# Patient Record
Sex: Female | Born: 1945 | Race: White | Hispanic: No | Smoking: Never smoker
Health system: Southern US, Community
[De-identification: ages and names within clinical notes are randomized; demographics above are authoritative.]

## PROBLEM LIST (undated history)

## (undated) DIAGNOSIS — Z860100 Personal history of colon polyps, unspecified: Secondary | ICD-10-CM

## (undated) DIAGNOSIS — A6000 Herpesviral infection of urogenital system, unspecified: Secondary | ICD-10-CM

## (undated) DIAGNOSIS — C4491 Basal cell carcinoma of skin, unspecified: Secondary | ICD-10-CM

## (undated) DIAGNOSIS — M1990 Unspecified osteoarthritis, unspecified site: Secondary | ICD-10-CM

## (undated) DIAGNOSIS — E7800 Pure hypercholesterolemia, unspecified: Secondary | ICD-10-CM

## (undated) DIAGNOSIS — E559 Vitamin D deficiency, unspecified: Secondary | ICD-10-CM

## (undated) DIAGNOSIS — M810 Age-related osteoporosis without current pathological fracture: Secondary | ICD-10-CM

## (undated) HISTORY — PX: FRACTURE SURGERY: SHX138

## (undated) HISTORY — PX: COLONOSCOPY: SHX174

## (undated) HISTORY — PX: BREAST CYST ASPIRATION: SHX578

## (undated) HISTORY — PX: WRIST SURGERY: SHX841

---

## 2008-07-19 ENCOUNTER — Ambulatory Visit: Payer: Self-pay | Admitting: Gastroenterology

## 2008-10-10 ENCOUNTER — Emergency Department: Payer: Self-pay | Admitting: Emergency Medicine

## 2008-10-15 ENCOUNTER — Ambulatory Visit: Payer: Self-pay | Admitting: Orthopedic Surgery

## 2008-10-19 ENCOUNTER — Ambulatory Visit: Payer: Self-pay | Admitting: Orthopedic Surgery

## 2013-09-01 ENCOUNTER — Ambulatory Visit: Payer: Self-pay | Admitting: Gastroenterology

## 2013-09-02 LAB — PATHOLOGY REPORT

## 2014-09-22 ENCOUNTER — Other Ambulatory Visit: Payer: Self-pay | Admitting: Internal Medicine

## 2014-09-22 DIAGNOSIS — IMO0002 Reserved for concepts with insufficient information to code with codable children: Secondary | ICD-10-CM

## 2014-09-28 ENCOUNTER — Ambulatory Visit
Admission: RE | Admit: 2014-09-28 | Discharge: 2014-09-28 | Disposition: A | Discharge status: Home / Self Care | Payer: Commercial Managed Care - HMO | Source: Ambulatory Visit | Attending: Internal Medicine | Admitting: Internal Medicine

## 2014-09-28 DIAGNOSIS — IMO0002 Reserved for concepts with insufficient information to code with codable children: Secondary | ICD-10-CM

## 2014-09-28 DIAGNOSIS — N941 Dyspareunia: Secondary | ICD-10-CM | POA: Insufficient documentation

## 2014-11-23 ENCOUNTER — Encounter
Admission: RE | Admit: 2014-11-23 | Discharge: 2014-11-23 | Disposition: A | Discharge status: Home / Self Care | Payer: Commercial Managed Care - HMO | Source: Ambulatory Visit | Attending: Obstetrics and Gynecology | Admitting: Obstetrics and Gynecology

## 2014-11-23 DIAGNOSIS — Z01818 Encounter for other preprocedural examination: Secondary | ICD-10-CM | POA: Diagnosis not present

## 2014-11-23 HISTORY — DX: Pure hypercholesterolemia, unspecified: E78.00

## 2014-11-23 HISTORY — DX: Unspecified osteoarthritis, unspecified site: M19.90

## 2014-11-23 LAB — BASIC METABOLIC PANEL
Anion gap: 8 (ref 5–15)
BUN: 26 mg/dL — AB (ref 6–20)
CALCIUM: 9.4 mg/dL (ref 8.9–10.3)
CO2: 27 mmol/L (ref 22–32)
CREATININE: 0.66 mg/dL (ref 0.44–1.00)
Chloride: 106 mmol/L (ref 101–111)
Glucose, Bld: 98 mg/dL (ref 65–99)
Potassium: 4.1 mmol/L (ref 3.5–5.1)
SODIUM: 141 mmol/L (ref 135–145)

## 2014-11-23 LAB — CBC
HEMATOCRIT: 40.1 % (ref 35.0–47.0)
HEMOGLOBIN: 13.5 g/dL (ref 12.0–16.0)
MCH: 30.2 pg (ref 26.0–34.0)
MCHC: 33.7 g/dL (ref 32.0–36.0)
MCV: 89.6 fL (ref 80.0–100.0)
Platelets: 286 10*3/uL (ref 150–440)
RBC: 4.48 MIL/uL (ref 3.80–5.20)
RDW: 13 % (ref 11.5–14.5)
WBC: 5.5 10*3/uL (ref 3.6–11.0)

## 2014-11-23 LAB — TYPE AND SCREEN
ABO/RH(D): A POS
ANTIBODY SCREEN: NEGATIVE

## 2014-11-23 LAB — ABO/RH: ABO/RH(D): A POS

## 2014-11-23 NOTE — Patient Instructions (Signed)
  Your procedure is scheduled on: Friday 12/03/2014 Report to Day Surgery. 2ND FLOOR MEDICAL MALL ENTRANCE To find out your arrival time please call [phone] between 1PM - 3PM on Thursday 12/02/2014.  Remember: Instructions that are not followed completely may result in serious medical risk, up to and including death, or upon the discretion of your surgeon and anesthesiologist your surgery may need to be rescheduled.    __X__ 1. Do not eat food or drink liquids after midnight. No gum chewing or hard candies.     __X__ 2. No Alcohol for 24 hours before or after surgery.   ____ 3. Bring all medications with you on the day of surgery if instructed.    __X__ 4. Notify your doctor if there is any change in your medical condition     (cold, fever, infections).     Do not wear jewelry, make-up, hairpins, clips or nail polish.  Do not wear lotions, powders, or perfumes. You may wear deodorant.  Do not shave 48 hours prior to surgery. Men may shave face and neck.  Do not bring valuables to the hospital.    [facility] is not responsible for any belongings or valuables.               Contacts, dentures or bridgework may not be worn into surgery.  Leave your suitcase in the car. After surgery it may be brought to your room.  For patients admitted to the hospital, discharge time is determined by your                treatment team.   Patients discharged the day of surgery will not be allowed to drive home.   Please read over the following fact sheets that you were given:   Surgical Site Infection Prevention   ____ Take these medicines the morning of surgery with A SIP OF WATER:    1.   2.   3.   4.  5.  6.  ____ Fleet Enema (as directed)   ____ Use CHG Soap as directed  ____ Use inhalers on the day of surgery  ____ Stop metformin 2 days prior to surgery    ____ Take 1/2 of usual insulin dose the night before surgery and none on the morning of surgery.   __X__ Stop  Coumadin/Plavix/aspirin on 11/24/2014 (10 DAYS PRIOR TO PROCEDURE)  ____ Stop Anti-inflammatories on    __X__ Stop supplements until after surgery.  11/27/2014 (7 DAYS PRIOR TO PROCEDURE) B12, FISH OIL  ____ Bring C-Pap to the hospital.

## 2014-11-23 NOTE — H&P (Addendum)
LAYTON TAPPAN is a 69 y.o. female who presents for follow up after embx.  Hx of pelvic pain: - 3 month h/o left pelvic pain during intercourse. Patient only experiences the pain during sex. She describes it as a shooting pain radiating to rib cage, lasts a few minutes until sex over. No abnl discharge, no vaginal bleeding  Menopause age 59, histroy of AUB prior to menopause, took a medicine to regulate menses prior to menopause, unsure of the name.  C/S x 2 No ha, blurry vision, other complaints  No h/o abnl paps  12 system ROS otherwise negative  Patient had a TVUS ordered by her Internist on 09/28/14 which showed an EMS of 23mm with 10mm cystic structure. No uterine or adnexal masses visualized.   Menstrual History: OB History    Gravida Para Term Preterm AB TAB SAB Ectopic Multiple Living   2 2 2             No LMP recorded. Patient is postmenopausal.     Past Medical History:  has a past medical history of Unspecified vitamin D deficiency; Other closed fractures of distal end of radius (alone); Senile osteoporosis; Genital herpes; Colon polyp (09/01/13); Other and unspecified hyperlipidemia; and Endometrial thickening on ultra sound. Problem List: has Benign neoplasm of colon and Other and unspecified hyperlipidemia on her problem list. Past Surgical History:  has past surgical history that includes LEFT WRIST SURGERY 2010; C-SECTIONS x 2; Colonoscopy (07/19/2008); Colonoscopy (09/01/13); and Cesarean section. Family History: family history includes COPD in her mother; Diabetes type II in her sister; Liver disease in her father. Social History:  reports that she has never smoked. She has never used smokeless tobacco. She reports that she does not drink alcohol or use illicit drugs. Current Medications: has a current medication list which includes the following prescription(s): calcium carbonate, cyanocobalamin, magnesium, multivitamin, omega-3 fatty  acids-vitamin e, simvastatin, valacyclovir, and zinc. Prior to encounter Medications:  Current Outpatient Prescriptions on File Prior to Visit  Medication Sig Dispense Refill  . calcium carbonate (CALCIUM 600) 600 mg (1,500 mg) Tab tablet Take 600 mg by mouth 2 (two) times daily with meals.    . cyanocobalamin (VITAMIN B-12) 1000 MCG tablet Take 1,000 mcg by mouth once daily.    . magnesium 200 mg Take by mouth once daily.    . multivitamin capsule Take 1 capsule by mouth once daily.    Marland Kitchen omega-3 fatty acids-vitamin E (FISH OIL) 1,000 mg Take 1 capsule by mouth once daily.    . simvastatin (ZOCOR) 20 MG tablet TAKE ONE TABLET BY MOUTH NIGHTLY 90 tablet 1  . valACYclovir (VALTREX) 500 MG tablet Take 500 mg by mouth 2 (two) times daily as needed.    . zinc 15 mg Tab Take by mouth once daily.     No current facility-administered medications on file prior to visit.   Allergies: has No Known Allergies.   Objective:     Vitals    Filed Vitals:   11/18/14 1012  BP: 159/73  Pulse: 67  Weight: 61.508 kg (135 lb 9.6 oz)       PHYSICAL EXAM: BP 159/73 mmHg  Pulse 67  Wt 61.508 kg (135 lb 9.6 oz) Body mass index is 23.26 kg/(m^2). General appearance: alert, cooperative, pleasant, in no acute distress Heart: regular rate and rhythm, without murmur Lungs: clear to auscultation, without rales or wheeze, good air exchange Abdomen: soft, nondistended, nontender, no hepatosplenomegaly or masses Ext: no edema in LE bilaterally,  good distal pulses  Pelvic exam: VULVA: normal appearing vulva with no masses, tenderness or lesions, VAGINA: atrophic, CERVIX: normal appearing cervix without discharge or lesions,; no CMT; UTERUS: uterus is normal size, shape, consistency, tender on bimanual at fundus in left; ADNEXA: normal adnexa in size, nontender and no masses.  Embx: fragments of endometrial polyp, no  dysplasia/neoplasia   Assessment:     69yo G2P2 with left pelvic dysparunia, thickened EMS on sono, and evidence of endometrial polyp on embx. To schedule for [facility] hysteroscopy, polypectomy.  Plan:    1. Endometrial polyp in post-menopausal woman - D&C hysteroscopy, polypectomy - r/b/a reviewed, Risks of surgery were discussed with the patient including but not limited to: bleeding which may require transfusion; infection which may require antibiotics; injury to uterus or surrounding organs; intrauterine scarring which may impair future fertility; need for additional procedures including laparotomy or laparoscopy; and other postoperative/anesthesia complications. Written informed consent was obtained. - This is a scheduled same-day surgery. She will have a postop visit in 2 weeks to review operative findings and pathology.  2. Elevated BP on two office visits - pt states she is nervous, but will likely need evaluation  - will order EKG and BMP in addition to CBC and T&S for pre-op testing - counseling done on lifestyle modifications  Joylene Igo, MD

## 2014-12-03 ENCOUNTER — Encounter
Admission: RE | Disposition: A | Discharge status: Home / Self Care | Payer: Self-pay | Source: Ambulatory Visit | Attending: Obstetrics and Gynecology

## 2014-12-03 ENCOUNTER — Ambulatory Visit
Admission: RE | Admit: 2014-12-03 | Discharge: 2014-12-03 | Disposition: A | Discharge status: Home / Self Care | Payer: Commercial Managed Care - HMO | Source: Ambulatory Visit | Attending: Obstetrics and Gynecology | Admitting: Obstetrics and Gynecology

## 2014-12-03 ENCOUNTER — Ambulatory Visit: Payer: Commercial Managed Care - HMO | Admitting: Anesthesiology

## 2014-12-03 ENCOUNTER — Encounter: Payer: Self-pay | Admitting: *Deleted

## 2014-12-03 DIAGNOSIS — Z86010 Personal history of colonic polyps: Secondary | ICD-10-CM | POA: Insufficient documentation

## 2014-12-03 DIAGNOSIS — Z825 Family history of asthma and other chronic lower respiratory diseases: Secondary | ICD-10-CM | POA: Insufficient documentation

## 2014-12-03 DIAGNOSIS — Z833 Family history of diabetes mellitus: Secondary | ICD-10-CM | POA: Diagnosis not present

## 2014-12-03 DIAGNOSIS — Z79899 Other long term (current) drug therapy: Secondary | ICD-10-CM | POA: Insufficient documentation

## 2014-12-03 DIAGNOSIS — N950 Postmenopausal bleeding: Secondary | ICD-10-CM | POA: Diagnosis present

## 2014-12-03 DIAGNOSIS — E559 Vitamin D deficiency, unspecified: Secondary | ICD-10-CM | POA: Diagnosis not present

## 2014-12-03 DIAGNOSIS — Z8379 Family history of other diseases of the digestive system: Secondary | ICD-10-CM | POA: Insufficient documentation

## 2014-12-03 DIAGNOSIS — R938 Abnormal findings on diagnostic imaging of other specified body structures: Secondary | ICD-10-CM | POA: Insufficient documentation

## 2014-12-03 DIAGNOSIS — M1990 Unspecified osteoarthritis, unspecified site: Secondary | ICD-10-CM | POA: Insufficient documentation

## 2014-12-03 DIAGNOSIS — E785 Hyperlipidemia, unspecified: Secondary | ICD-10-CM | POA: Diagnosis not present

## 2014-12-03 DIAGNOSIS — N840 Polyp of corpus uteri: Secondary | ICD-10-CM | POA: Diagnosis not present

## 2014-12-03 HISTORY — PX: HYSTEROSCOPY WITH D & C: SHX1775

## 2014-12-03 SURGERY — DILATATION AND CURETTAGE /HYSTEROSCOPY
Anesthesia: General | Site: Uterus | Wound class: Clean Contaminated

## 2014-12-03 MED ORDER — GLYCOPYRROLATE 0.2 MG/ML IJ SOLN
INTRAMUSCULAR | Status: DC | PRN
  Administered 2014-12-03: 0.2 mg via INTRAVENOUS

## 2014-12-03 MED ORDER — LACTATED RINGERS IV SOLN
INTRAVENOUS | Status: DC
  Administered 2014-12-03: 08:00:00 via INTRAVENOUS

## 2014-12-03 MED ORDER — PROPOFOL 10 MG/ML IV BOLUS
INTRAVENOUS | Status: DC | PRN
  Administered 2014-12-03: 150 mg via INTRAVENOUS

## 2014-12-03 MED ORDER — ONDANSETRON HCL 4 MG/2ML IJ SOLN: 4.0000 mg | Freq: Once | INTRAMUSCULAR | Status: DC | PRN

## 2014-12-03 MED ORDER — ONDANSETRON HCL 4 MG/2ML IJ SOLN
INTRAMUSCULAR | Status: DC | PRN
Start: 2014-12-03 — End: 2014-12-03
  Administered 2014-12-03: 4 mg via INTRAVENOUS

## 2014-12-03 MED ORDER — FENTANYL CITRATE (PF) 100 MCG/2ML IJ SOLN
INTRAMUSCULAR | Status: DC | PRN
  Administered 2014-12-03: 50 ug via INTRAVENOUS

## 2014-12-03 MED ORDER — MIDAZOLAM HCL 2 MG/2ML IJ SOLN
INTRAMUSCULAR | Status: DC | PRN
  Administered 2014-12-03: 1 mg via INTRAVENOUS

## 2014-12-03 MED ORDER — LIDOCAINE HCL (CARDIAC) 20 MG/ML IV SOLN
INTRAVENOUS | Status: DC | PRN
  Administered 2014-12-03: 30 mg via INTRAVENOUS

## 2014-12-03 MED ORDER — FAMOTIDINE 20 MG PO TABS
20.0000 mg | ORAL_TABLET | Freq: Once | ORAL | Status: AC
  Administered 2014-12-03: 20 mg via ORAL

## 2014-12-03 MED ORDER — FENTANYL CITRATE (PF) 100 MCG/2ML IJ SOLN: 25.0000 ug | INTRAMUSCULAR | Status: DC | PRN

## 2014-12-03 MED ORDER — FAMOTIDINE 20 MG PO TABS
ORAL_TABLET | ORAL | Status: AC
  Administered 2014-12-03: 20 mg via ORAL
  Filled 2014-12-03: qty 1

## 2014-12-03 SURGICAL SUPPLY — 17 items
CANISTER SUCT 3000ML (MISCELLANEOUS) ×3 IMPLANT
CATH ROBINSON RED A/P 16FR (CATHETERS) ×3 IMPLANT
ELECT RESECT POWERBALL 24F (MISCELLANEOUS) ×3 IMPLANT
GLOVE BIO SURGEON STRL SZ7 (GLOVE) ×3 IMPLANT
GLOVE BIOGEL PI IND STRL 7.5 (GLOVE) ×1 IMPLANT
GLOVE BIOGEL PI INDICATOR 7.5 (GLOVE) ×2
GOWN STRL REUS W/ TWL LRG LVL3 (GOWN DISPOSABLE) ×2 IMPLANT
GOWN STRL REUS W/TWL LRG LVL3 (GOWN DISPOSABLE) ×4
IV LACTATED RINGERS 1000ML (IV SOLUTION) ×3 IMPLANT
KIT RM TURNOVER CYSTO AR (KITS) ×3 IMPLANT
LOOP CUT RT ANGL 28F (MISCELLANEOUS) ×3 IMPLANT
PACK DNC HYST (MISCELLANEOUS) ×3 IMPLANT
PAD GROUND ADULT SPLIT (MISCELLANEOUS) ×3 IMPLANT
PAD OB MATERNITY 4.3X12.25 (PERSONAL CARE ITEMS) ×3 IMPLANT
PAD PREP 24X41 OB/GYN DISP (PERSONAL CARE ITEMS) ×3 IMPLANT
TUBING CONNECTING 10 (TUBING) ×2 IMPLANT
TUBING CONNECTING 10' (TUBING) ×1

## 2014-12-03 NOTE — Anesthesia Postprocedure Evaluation (Signed)
  Anesthesia Post-op Note  Patient: Tracy Andrews  Procedure(s) Performed: Procedure(s): DILATATION AND CURETTAGE /HYSTEROSCOPY (N/A)  Anesthesia type:General  Patient location: PACU  Post pain: Pain level controlled  Post assessment: Post-op Vital signs reviewed, Patient's Cardiovascular Status Stable, Respiratory Function Stable, Patent Airway and No signs of Nausea or vomiting  Post vital signs: Reviewed and stable  Last Vitals:  Filed Vitals:   12/03/14 1132  BP: 183/66  Pulse: 56  Temp:   Resp: 14    Level of consciousness: awake, alert  and patient cooperative  Complications: No apparent anesthesia complications

## 2014-12-03 NOTE — Anesthesia Procedure Notes (Signed)
Date/Time: 12/03/2014 9:33 AM Performed by: Courtney Paris Pre-anesthesia Checklist: Patient identified, Emergency Drugs available, Suction available and Patient being monitored Patient Re-evaluated:Patient Re-evaluated prior to inductionOxygen Delivery Method: Circle system utilized Preoxygenation: Pre-oxygenation with 100% oxygen Intubation Type: IV induction and Combination inhalational/ intravenous induction Ventilation: Mask ventilation without difficulty LMA: LMA inserted LMA Size: 4.0 Grade View: Grade II Number of attempts: 1

## 2014-12-03 NOTE — Anesthesia Preprocedure Evaluation (Signed)
Anesthesia Evaluation  Patient identified by MRN, date of birth, ID band Patient awake    Reviewed: Allergy & Precautions, NPO status , Patient's Chart, lab work & pertinent test results, reviewed documented beta blocker date and time   Airway Mallampati: II  TM Distance: >3 FB     Dental  (+) Chipped   Pulmonary          Cardiovascular     Neuro/Psych    GI/Hepatic   Endo/Other    Renal/GU      Musculoskeletal  (+) Arthritis -,   Abdominal   Peds  Hematology   Anesthesia Other Findings   Reproductive/Obstetrics                             Anesthesia Physical Anesthesia Plan  ASA: II  Anesthesia Plan: General   Post-op Pain Management:    Induction: Intravenous  Airway Management Planned: LMA  Additional Equipment:   Intra-op Plan:   Post-operative Plan:   Informed Consent: I have reviewed the patients History and Physical, chart, labs and discussed the procedure including the risks, benefits and alternatives for the proposed anesthesia with the patient or authorized representative who has indicated his/her understanding and acceptance.     Plan Discussed with: CRNA  Anesthesia Plan Comments:         Anesthesia Quick Evaluation  

## 2014-12-03 NOTE — Discharge Instructions (Signed)
Thank you for letting me take care of you! Here are your instructions. See you in 2 weeks!  - Dr Newman Nip  Hysteroscopy, Care After Refer to this sheet in the next few weeks. These instructions provide you with information on caring for yourself after your procedure. Your health care provider may also give you more specific instructions. Your treatment has been planned according to current medical practices, but problems sometimes occur. Call your health care provider if you have any problems or questions after your procedure.  WHAT TO EXPECT AFTER THE PROCEDURE After your procedure, it is typical to have the following:  You may have some cramping. This normally lasts for a couple days.  You may have bleeding. This can vary from light spotting for a few days to menstrual-like bleeding for 3-7 days. HOME CARE INSTRUCTIONS  Rest for the first 1-2 days after the procedure.  Take ibuprofen 400mg  every 4 hours or 800mg  every 8 hours as needed for pain  Cramping should last 24-48hrs  Spotting is expected for at least a week  Only take over-the-counter or prescription medicines as directed by your health care provider.   Take showers instead of baths for 2 weeks or as directed by your health care provider.  Do not drive for 24 hours or as directed.  Do not drink alcohol while taking pain medicine.  Do not use tampons, douche, or have sexual intercourse for 2 weeks or until your health care provider says it is okay.  Follow your health care provider's advice about diet, exercise, and lifting.  If you develop constipation, you may:  Take a mild laxative if your health care provider approves.  Add bran foods to your diet.  Drink enough fluids to keep your urine clear or pale yellow.  Try to have someone with you or available to you for the first 24-48 hours, especially if you were given a general anesthetic.  Follow up with your health care provider as directed. SEEK MEDICAL CARE  IF:  You feel dizzy or lightheaded.  You feel sick to your stomach (nauseous).  You have abnormal vaginal discharge that is itchy or foul smelling  You have a rash.  You have pain that is not controlled with medicine. SEEK IMMEDIATE MEDICAL CARE IF:  You have bleeding that is heavier than a normal menstrual period.  You have a fever.  You have increasing cramps or pain, not controlled with medicine.  You have new belly (abdominal) pain.  You pass out.  You have pain in the tops of your shoulders (shoulder strap areas).  You have shortness of breath. Document Released: 12/31/2012 Document Reviewed: 12/31/2012 [facility] Patient Information 2015 [place], [place]. This information is not intended to replace advice given to you by your health care provider. Make sure you discuss any questions you have with your health care provider.

## 2014-12-03 NOTE — Transfer of Care (Signed)
Immediate Anesthesia Transfer of Care Note  Patient: Tracy Andrews  Procedure(s) Performed: Procedure(s): DILATATION AND CURETTAGE /HYSTEROSCOPY (N/A)  Patient Location: PACU  Anesthesia Type:General  Level of Consciousness: awake, oriented and patient cooperative  Airway & Oxygen Therapy: Patient Spontanous Breathing and Patient connected to face mask oxygen  Post-op Assessment: Report given to RN and Post -op Vital signs reviewed and stable  Post vital signs: Reviewed and stable  Last Vitals:  Filed Vitals:   12/03/14 0800  BP: 165/77  Pulse: 76  Temp: 36.7 C  Resp: 16    Complications: No apparent anesthesia complications

## 2014-12-03 NOTE — Op Note (Signed)
Operative Report Hysteroscopy with Dilation and Curettage   Indications: Post-menopausal bleeding, evidence of polyp on endometrial biopsy  Pre-operative Diagnosis: endometrial polyp  Post-operative Diagnosis: same.  Procedure: 1. Exam under anesthesia 2. D&C 3. Hysteroscopy 4. Polypectomy  Surgeon: Joylene Igo, MD  Assistant(s):  None  Anesthesia: General LMA anesthesia  Estimated Blood Loss:  Minimal         Intraoperative medications: none         Total IV Fluids: 558ml  Urine Output: 43ml         Specimens:  endometrial curettings, endometrial polyp         Complications:  None; patient tolerated the procedure well.         Disposition: PACU - hemodynamically stable.         Condition: stable  Findings: Uterus measuring 6 cm by sound; normal cervix, vagina, perineum. 1cm polyp noted along right lateral wall near right ostium and obstructing its view. Otherwise, normal cavity, atrophic  Indication for procedure/Consents: 69 y.o. here for scheduled surgery for the aforementioned diagnoses.   Risks of surgery were discussed with the patient including but not limited to: bleeding which may require transfusion; infection which may require antibiotics; injury to uterus or surrounding organs; intrauterine scarring which may impair future fertility; need for additional procedures including laparotomy or laparoscopy; and other postoperative/anesthesia complications. Written informed consent was obtained.    Procedure Details:   The patient was taken to the operating room where general LMA mask anesthesia was administered and was found to be adequate. After a formal and adequate timeout was performed, she was placed in the dorsal lithotomy position and examined with the above findings. She was then prepped and draped in the sterile manner. Her bladder was catheterized for an estimated amount of clear, yellow urine. A weighed speculum was then placed in the patient's vagina  and a single tooth tenaculum was applied to the anterior lip of the cervix.  Her cervix was serially dilated to 67mm with Hagar dilators. The hysteroscope was introduced to reveal the above findings. A polyp forcep was used to remove the polyp. The hysteroscope was reintroduced to confirm removal of entire polyp.  A sharp curettage was then performed until there was a gritty texture in all four quadrants. The tenaculum was removed from the anterior lip of the cervix and the vaginal speculum was removed after applying silver nitrate for good hemostasis. The patient tolerated the procedure well and was taken to the recovery area awake and in stable condition.   The patient will be discharged to home as per PACU criteria. Routine postoperative instructions given. She was prescribed Ibuprofen and Colace. She will follow up in the clinic in two weeks for postoperative evaluation.

## 2014-12-03 NOTE — Interval H&P Note (Signed)
History and Physical Interval Note:  12/03/2014 8:36 AM  Tracy Andrews  has presented today for surgery, with the diagnosis of ENDOMETRIAL POLYP  The various methods of treatment have been discussed with the patient and family. After consideration of risks, benefits and other options for treatment, the patient has consented to  Procedure(s): DILATATION AND CURETTAGE /HYSTEROSCOPY (N/A) as a surgical intervention .  The patient's history has been reviewed, patient examined, no change in status, stable for surgery.  I have reviewed the patient's chart and labs.  Questions were answered to the patient's satisfaction.     [place]

## 2014-12-06 LAB — SURGICAL PATHOLOGY

## 2015-04-06 DIAGNOSIS — E7800 Pure hypercholesterolemia, unspecified: Secondary | ICD-10-CM | POA: Diagnosis not present

## 2015-04-06 DIAGNOSIS — N941 Unspecified dyspareunia: Secondary | ICD-10-CM | POA: Diagnosis not present

## 2015-04-06 DIAGNOSIS — J069 Acute upper respiratory infection, unspecified: Secondary | ICD-10-CM | POA: Diagnosis not present

## 2015-04-13 DIAGNOSIS — Z139 Encounter for screening, unspecified: Secondary | ICD-10-CM | POA: Diagnosis not present

## 2015-04-13 DIAGNOSIS — Z0000 Encounter for general adult medical examination without abnormal findings: Secondary | ICD-10-CM | POA: Diagnosis not present

## 2015-04-13 DIAGNOSIS — E7800 Pure hypercholesterolemia, unspecified: Secondary | ICD-10-CM | POA: Diagnosis not present

## 2015-04-13 DIAGNOSIS — Z23 Encounter for immunization: Secondary | ICD-10-CM | POA: Diagnosis not present

## 2015-04-18 DIAGNOSIS — Z1231 Encounter for screening mammogram for malignant neoplasm of breast: Secondary | ICD-10-CM | POA: Diagnosis not present

## 2015-10-05 DIAGNOSIS — Z139 Encounter for screening, unspecified: Secondary | ICD-10-CM | POA: Diagnosis not present

## 2015-10-05 DIAGNOSIS — Z0000 Encounter for general adult medical examination without abnormal findings: Secondary | ICD-10-CM | POA: Diagnosis not present

## 2015-10-05 DIAGNOSIS — Z23 Encounter for immunization: Secondary | ICD-10-CM | POA: Diagnosis not present

## 2015-10-05 DIAGNOSIS — E7800 Pure hypercholesterolemia, unspecified: Secondary | ICD-10-CM | POA: Diagnosis not present

## 2015-10-12 DIAGNOSIS — N941 Unspecified dyspareunia: Secondary | ICD-10-CM | POA: Diagnosis not present

## 2015-10-12 DIAGNOSIS — R3129 Other microscopic hematuria: Secondary | ICD-10-CM | POA: Diagnosis not present

## 2015-10-12 DIAGNOSIS — E7800 Pure hypercholesterolemia, unspecified: Secondary | ICD-10-CM | POA: Diagnosis not present

## 2015-10-12 DIAGNOSIS — R221 Localized swelling, mass and lump, neck: Secondary | ICD-10-CM | POA: Diagnosis not present

## 2015-10-12 DIAGNOSIS — Z23 Encounter for immunization: Secondary | ICD-10-CM | POA: Diagnosis not present

## 2015-10-28 ENCOUNTER — Ambulatory Visit (INDEPENDENT_AMBULATORY_CARE_PROVIDER_SITE_OTHER): Payer: PPO | Admitting: Urology

## 2015-10-28 ENCOUNTER — Encounter: Payer: Self-pay | Admitting: Urology

## 2015-10-28 VITALS — BP 171/71 | HR 80 | Ht 64.0 in | Wt 134.8 lb

## 2015-10-28 DIAGNOSIS — R3129 Other microscopic hematuria: Secondary | ICD-10-CM

## 2015-10-28 LAB — URINALYSIS, COMPLETE
BILIRUBIN UA: NEGATIVE
Glucose, UA: NEGATIVE
KETONES UA: NEGATIVE
Leukocytes, UA: NEGATIVE
NITRITE UA: NEGATIVE
PH UA: 7 (ref 5.0–7.5)
Protein, UA: NEGATIVE
SPEC GRAV UA: 1.02 (ref 1.005–1.030)
UUROB: 0.2 mg/dL (ref 0.2–1.0)

## 2015-10-28 LAB — MICROSCOPIC EXAMINATION

## 2015-10-28 NOTE — Progress Notes (Signed)
10/28/2015 12:01 PM   Tracy Andrews 12-12-1945 [id number]  Referring provider: Tracie Harrier, MD [address] [facility] [place], [place] [address]  Chief Complaint  Patient presents with  . Hematuria    referred by Dr. Ginette Pitman    HPI: Patient is a 70 -year-old Caucasian female who presents today as a referral from their PCP, Dr. Ginette Pitman, for microscopic hematuria.  Patient was found to have microscopic hematuria on 09/15/2013, 04/06/2015, and 10/05/2015 with 4-10 RBC's/hpf.  Patient doesn't have a prior history of microscopic hematuria.    She does not have a prior history of recurrent urinary tract infections, nephrolithiasis, trauma to the genitourinary tract or malignancies of the genitourinary tract.   She does not have a family medical history of nephrolithiasis, malignancies of the genitourinary tract or hematuria.    Today, she is not having symptoms of frequent urination, urgency, dysuria, nocturia, incontinence, hesitancy, intermittency, straining to urinate or a weak urinary stream.  Her UA today demonstrates 3-10 RBC's/hpf.  She is not experiencing any suprapubic pain, abdominal pain or flank pain.  She denies any recent fevers, chills, nausea or vomiting.   She has not had any recent imaging studies.   She is not a smoker. She was exposed to secondhand smoke.  Her parents were heavy smokers and her husband just quit smoking two years ago.  She has not worked with [employer].    PMH: Past Medical History:  Diagnosis Date  . Arthritis   . Cancer ([place])    skin  . Elevated cholesterol     Surgical History: Past Surgical History:  Procedure Laterality Date  . CESAREAN SECTION     x 2  . HYSTEROSCOPY W/D&C N/A 12/03/2014   Procedure: DILATATION AND CURETTAGE /HYSTEROSCOPY;  Surgeon: Lorette Ang, MD;  Location: [facility] ORS;  Service: Gynecology;  Laterality: N/A;  . WRIST SURGERY Left    put a pin in it    Home Medications:    Medication List       Accurate as of 10/28/15 12:01 PM. Always use your most recent med list.          aspirin EC 81 MG tablet Take 81 mg by mouth daily.   CALCIUM 600 600 MG Tabs tablet Generic drug:  calcium carbonate Take by mouth.   conjugated estrogens vaginal cream Commonly known as:  PREMARIN Place vaginally.   Fish Oil 1000 MG Caps Take 1 capsule by mouth 2 (two) times daily.   FISH OIL PO Take by mouth.   Magnesium 250 MG Tabs Take 1 tablet by mouth daily.   multivitamin capsule Take by mouth.   multivitamin with minerals Tabs tablet Take 1 tablet by mouth daily.   simvastatin 20 MG tablet Commonly known as:  ZOCOR Take 20 mg by mouth at bedtime.   valACYclovir 500 MG tablet Commonly known as:  VALTREX Take by mouth.   vitamin B-12 500 MCG tablet Commonly known as:  CYANOCOBALAMIN Take 500 mcg by mouth daily.   Vitamin D-3 1000 units Caps Take 1 capsule by mouth daily.   Zinc Gluconate 15 MG Tabs Take by mouth.       Allergies: No Known Allergies  Family History: Family History  Problem Relation Age of Onset  . Kidney disease Neg Hx   . Bladder Cancer Neg Hx     Social History:  reports that she has never smoked. She has never used smokeless tobacco. She reports that she does  not drink alcohol or use drugs.  ROS: UROLOGY Frequent Urination?: No Hard to postpone urination?: No Burning/pain with urination?: No Get up at night to urinate?: No Leakage of urine?: No Urine stream starts and stops?: No Trouble starting stream?: No Do you have to strain to urinate?: No Blood in urine?: Yes Urinary tract infection?: No Sexually transmitted disease?: No Injury to kidneys or bladder?: No Painful intercourse?: Yes Weak stream?: No Currently pregnant?: No Vaginal bleeding?: No Last menstrual period?: n  Gastrointestinal Nausea?: No Vomiting?: No Indigestion/heartburn?: No Diarrhea?: No Constipation?: No  Constitutional Fever:  No Night sweats?: No Weight loss?: No Fatigue?: No  Skin Skin rash/lesions?: No Itching?: No  Eyes Blurred vision?: No Double vision?: No  Ears/Nose/Throat Sore throat?: No Sinus problems?: No  Hematologic/Lymphatic Swollen glands?: No Easy bruising?: No  Cardiovascular Leg swelling?: No Chest pain?: No  Respiratory Cough?: No Shortness of breath?: No  Endocrine Excessive thirst?: No  Musculoskeletal Back pain?: No Joint pain?: No  Neurological Headaches?: No Dizziness?: No  Psychologic Depression?: No Anxiety?: No  Physical Exam: BP (!) 171/71   Pulse 80   Ht 5\' 4"  (1.626 m)   Wt 134 lb 12.8 oz (61.1 kg)   BMI 23.14 kg/m   Constitutional: Well nourished. Alert and oriented, No acute distress. HEENT:  AT, moist mucus membranes. Trachea midline, no masses. Cardiovascular: No clubbing, cyanosis, or edema. Respiratory: Normal respiratory effort, no increased work of breathing. GI: Abdomen is soft, non tender, non distended, no abdominal masses. Liver and spleen not palpable.  No hernias appreciated.  Stool sample for occult testing is not indicated.   GU: No CVA tenderness.  No bladder fullness or masses.  Atrophic external genitalia, normal pubic hair distribution, no lesions.  Normal urethral meatus, no lesions, no prolapse, no discharge.   No urethral masses, tenderness and/or tenderness. No bladder fullness, tenderness or masses. Pale vagina mucosa, poor estrogen effect, no discharge, no lesions, good pelvic support, no cystocele or rectocele noted.  No cervical motion tenderness.  Uterus is freely mobile and non-fixed.  No adnexal/parametria masses or tenderness noted.  Anus and perineum are without rashes or lesions.    Skin: No rashes, bruises or suspicious lesions. Lymph: No cervical or inguinal adenopathy. Neurologic: Grossly intact, no focal deficits, moving all 4 extremities. Psychiatric: Normal mood and affect.  Laboratory Data: Lab Results    Component Value Date   WBC 5.5 11/23/2014   HGB 13.5 11/23/2014   HCT 40.1 11/23/2014   MCV 89.6 11/23/2014   PLT 286 11/23/2014    Lab Results  Component Value Date   CREATININE 0.66 11/23/2014    Urinalysis Results for orders placed or performed in visit on 10/28/15  Microscopic Examination  Result Value Ref Range   WBC, UA 0-5 0 - 5 /hpf   RBC, UA 3-10 (A) 0 - 2 /hpf   Epithelial Cells (non renal) 0-10 0 - 10 /hpf   Bacteria, UA Few None seen/Few  Urinalysis, Complete  Result Value Ref Range   Specific Gravity, UA 1.020 1.005 - 1.030   pH, UA 7.0 5.0 - 7.5   Color, UA Yellow Yellow   Appearance Ur Clear Clear   Leukocytes, UA Negative Negative   Protein, UA Negative Negative/Trace   Glucose, UA Negative Negative   Ketones, UA Negative Negative   RBC, UA 1+ (A) Negative   Bilirubin, UA Negative Negative   Urobilinogen, Ur 0.2 0.2 - 1.0 mg/dL   Nitrite, UA Negative Negative  Microscopic Examination See below:     Assessment & Plan:    1. Microscopic hematuria  I explained to the patient that there are a number of causes that can be associated with blood in the urine, such as stones, UTI's, damage to the urinary tract and/or cancer.  At this time, I felt that the patient warranted further urologic evaluation.   The AUA guidelines state that a CT urogram is the preferred imaging study to evaluate hematuria.  I explained to the patient that a contrast material will be injected into a vein and that in rare instances, an allergic reaction can result and may even life threatening   The patient denies any allergies to contrast, iodine and/or seafood and is not taking metformin.  Her reproductive status is status post menopausal.  Following the imaging study,  I've recommended a cystoscopy. I described how this is performed, typically in an office setting with a flexible cystoscope. We described the risks, benefits, and possible side effects, the most common of which is a  minor amount of blood in the urine and/or burning which usually resolves in 24 to 48 hours.    The patient had the opportunity to ask questions which were answered. Based upon this discussion, the patient is willing to proceed. Therefore, I've ordered: a CT Urogram and cystoscopy.  She will return following all of the above for discussion of the results.     - Urinalysis, Complete - CULTURE, URINE COMPREHENSIVE - BUN+Creat   Return for CT Urogram and cystoscopy for hematuria.  These notes generated with voice recognition software. I apologize for typographical errors.  Zara Council, [facility] [address] [place], [place] [address] [phone]

## 2015-10-29 LAB — BUN+CREAT
BUN/Creatinine Ratio: 26 (ref 12–28)
BUN: 18 mg/dL (ref 8–27)
Creatinine, Ser: 0.7 mg/dL (ref 0.57–1.00)
GFR calc Af Amer: 102 mL/min/{1.73_m2} (ref >59)
GFR calc non Af Amer: 89 mL/min/{1.73_m2} (ref >59)

## 2015-10-31 LAB — CULTURE, URINE COMPREHENSIVE

## 2015-11-07 ENCOUNTER — Ambulatory Visit
Admission: RE | Admit: 2015-11-07 | Discharge: 2015-11-07 | Disposition: A | Discharge status: Home / Self Care | Payer: PPO | Source: Ambulatory Visit | Attending: Urology | Admitting: Urology

## 2015-11-07 ENCOUNTER — Ambulatory Visit: Admission: RE | Admit: 2015-11-07 | Payer: PPO | Source: Ambulatory Visit

## 2015-11-07 DIAGNOSIS — K5790 Diverticulosis of intestine, part unspecified, without perforation or abscess without bleeding: Secondary | ICD-10-CM | POA: Diagnosis not present

## 2015-11-07 DIAGNOSIS — K5730 Diverticulosis of large intestine without perforation or abscess without bleeding: Secondary | ICD-10-CM | POA: Diagnosis not present

## 2015-11-07 DIAGNOSIS — R911 Solitary pulmonary nodule: Secondary | ICD-10-CM | POA: Diagnosis not present

## 2015-11-07 DIAGNOSIS — R3129 Other microscopic hematuria: Secondary | ICD-10-CM | POA: Insufficient documentation

## 2015-11-07 DIAGNOSIS — N281 Cyst of kidney, acquired: Secondary | ICD-10-CM | POA: Diagnosis not present

## 2015-11-07 HISTORY — DX: Basal cell carcinoma of skin, unspecified: C44.91

## 2015-11-07 MED ORDER — IOPAMIDOL (ISOVUE-300) INJECTION 61%
125.0000 mL | Freq: Once | INTRAVENOUS | Status: AC | PRN
  Administered 2015-11-07: 125 mL via INTRAVENOUS

## 2015-11-08 ENCOUNTER — Telehealth: Payer: Self-pay

## 2015-11-08 NOTE — Telephone Encounter (Signed)
-----   Message from Nori Riis, PA-C sent at 11/07/2015  9:08 PM EDT ----- Please notify the patient that her CT report did not identify any abnormalities within her kidneys. She will receive a more detailed report at her cystoscopy appointment.

## 2015-11-08 NOTE — Telephone Encounter (Signed)
Spoke with pt in reference to CT results. Pt voiced understanding.  

## 2015-11-11 ENCOUNTER — Ambulatory Visit: Payer: Self-pay | Admitting: Urology

## 2015-11-22 ENCOUNTER — Encounter: Payer: Self-pay | Admitting: Urology

## 2015-11-22 ENCOUNTER — Ambulatory Visit (INDEPENDENT_AMBULATORY_CARE_PROVIDER_SITE_OTHER): Payer: PPO | Admitting: Urology

## 2015-11-22 VITALS — BP 163/71 | HR 64 | Ht 64.0 in | Wt 133.1 lb

## 2015-11-22 DIAGNOSIS — R3129 Other microscopic hematuria: Secondary | ICD-10-CM

## 2015-11-22 DIAGNOSIS — E785 Hyperlipidemia, unspecified: Secondary | ICD-10-CM | POA: Insufficient documentation

## 2015-11-22 DIAGNOSIS — D126 Benign neoplasm of colon, unspecified: Secondary | ICD-10-CM | POA: Insufficient documentation

## 2015-11-22 LAB — MICROSCOPIC EXAMINATION

## 2015-11-22 LAB — URINALYSIS, COMPLETE
BILIRUBIN UA: NEGATIVE
Glucose, UA: NEGATIVE
Ketones, UA: NEGATIVE
Leukocytes, UA: NEGATIVE
Nitrite, UA: NEGATIVE
PH UA: 5.5 (ref 5.0–7.5)
PROTEIN UA: NEGATIVE
Specific Gravity, UA: 1.025 (ref 1.005–1.030)
UUROB: 0.2 mg/dL (ref 0.2–1.0)

## 2015-11-22 MED ORDER — LIDOCAINE HCL 2 % EX GEL
1.0000 "application " | Freq: Once | CUTANEOUS | Status: AC
  Administered 2015-11-22: 1 via URETHRAL

## 2015-11-22 MED ORDER — CIPROFLOXACIN HCL 500 MG PO TABS
500.0000 mg | ORAL_TABLET | Freq: Once | ORAL | Status: AC
  Administered 2015-11-22: 500 mg via ORAL

## 2015-11-22 NOTE — Progress Notes (Signed)
11/22/2015 2:55 PM   Tracy Andrews 05/02/1945 [id number]  Referring provider: Tracie Harrier, MD [address] [facility] [place], [place] [address]  Chief Complaint  Patient presents with  . Cysto    HPI: Tracy Andrews is a 70yo seen for microhematuria. CT urogram was negative for calculi and renal/ureteral tumors. She denies gross hematuria.    PMH: Past Medical History:  Diagnosis Date  . Arthritis   . Basal cell carcinoma of skin    skin  . Elevated cholesterol     Surgical History: Past Surgical History:  Procedure Laterality Date  . CESAREAN SECTION     x 2  . HYSTEROSCOPY W/D&C N/A 12/03/2014   Procedure: DILATATION AND CURETTAGE /HYSTEROSCOPY;  Surgeon: Tracy Ang, MD;  Location: [facility] ORS;  Service: Gynecology;  Laterality: N/A;  . WRIST SURGERY Left    put a pin in it    Home Medications:    Medication List       Accurate as of 11/22/15  2:55 PM. Always use your most recent med list.          aspirin EC 81 MG tablet Take 81 mg by mouth daily.   CALCIUM 600 600 MG Tabs tablet Generic drug:  calcium carbonate Take by mouth.   conjugated estrogens vaginal cream Commonly known as:  PREMARIN Place vaginally.   Fish Oil 1000 MG Caps Take 1 capsule by mouth 2 (two) times daily.   FISH OIL PO Take by mouth.   Magnesium 250 MG Tabs Take 1 tablet by mouth daily.   multivitamin capsule Take by mouth.   multivitamin with minerals Tabs tablet Take 1 tablet by mouth daily.   simvastatin 20 MG tablet Commonly known as:  ZOCOR Take 20 mg by mouth at bedtime.   valACYclovir 500 MG tablet Commonly known as:  VALTREX Take by mouth.   vitamin B-12 500 MCG tablet Commonly known as:  CYANOCOBALAMIN Take 500 mcg by mouth daily.   Vitamin D-3 1000 units Caps Take 1 capsule by mouth daily.   Zinc Gluconate 15 MG Tabs Take by mouth.       Allergies: No Known Allergies  Family History: Family History  Problem  Relation Age of Onset  . Kidney disease Neg Hx   . Bladder Cancer Neg Hx     Social History:  reports that she has never smoked. She has never used smokeless tobacco. She reports that she does not drink alcohol or use drugs.  ROS:                                        Physical Exam: BP (!) 163/71   Pulse 64   Ht 5\' 4"  (1.626 m)   Wt 60.4 kg (133 lb 1.6 oz)   BMI 22.85 kg/m   Constitutional:  Alert and oriented, No acute distress. HEENT: [place] AT, moist mucus membranes.  Trachea midline, no masses. Cardiovascular: No clubbing, cyanosis, or edema. Respiratory: Normal respiratory effort, no increased work of breathing. GI: Abdomen is soft, nontender, nondistended, no abdominal masses GU: No CVA tenderness.  Skin: No rashes, bruises or suspicious lesions. Lymph: No cervical or inguinal adenopathy. Neurologic: Grossly intact, no focal deficits, moving all 4 extremities. Psychiatric: Normal mood and affect.  Laboratory Data: Lab Results  Component Value Date   WBC 5.5 11/23/2014   HGB 13.5 11/23/2014   HCT  40.1 11/23/2014   MCV 89.6 11/23/2014   PLT 286 11/23/2014    Lab Results  Component Value Date   CREATININE 0.70 10/28/2015    No results found for: PSA  No results found for: TESTOSTERONE  No results found for: HGBA1C  Urinalysis    Component Value Date/Time   APPEARANCEUR Clear 10/28/2015 1125   GLUCOSEU Negative 10/28/2015 1125   BILIRUBINUR Negative 10/28/2015 1125   PROTEINUR Negative 10/28/2015 1125   NITRITE Negative 10/28/2015 1125   LEUKOCYTESUR Negative 10/28/2015 1125     Cystoscopy Procedure Note  Patient identification was confirmed, informed consent was obtained, and patient was prepped using Betadine solution.  Lidocaine jelly was administered per urethral meatus.    Preoperative abx where received prior to procedure.    Procedure: - Flexible cystoscope introduced, without any difficulty.   - Thorough search of the  bladder revealed:    normal urethral meatus    normal urothelium    no stones    no ulcers     no tumors    no urethral polyps    no trabeculation  - Ureteral orifices were normal in position and appearance.  Post-Procedure: - Patient tolerated the procedure well   Pertinent Imaging: CT Urogram  Assessment & Plan:    1. Microscopic hematuria -RTC 6 months with UA - Urinalysis, Complete - lidocaine (XYLOCAINE) 2 % jelly 1 application; Place 1 application into the urethra once. - ciprofloxacin (CIPRO) tablet 500 mg; Take 1 tablet (500 mg total) by mouth once.   No Follow-up on file.  Tracy Bang, MD  [facility] [address], [place], [place] [address] [phone]

## 2016-01-19 DIAGNOSIS — H2513 Age-related nuclear cataract, bilateral: Secondary | ICD-10-CM | POA: Diagnosis not present

## 2016-01-19 DIAGNOSIS — D3132 Benign neoplasm of left choroid: Secondary | ICD-10-CM | POA: Diagnosis not present

## 2016-04-09 DIAGNOSIS — R221 Localized swelling, mass and lump, neck: Secondary | ICD-10-CM | POA: Diagnosis not present

## 2016-04-09 DIAGNOSIS — N941 Unspecified dyspareunia: Secondary | ICD-10-CM | POA: Diagnosis not present

## 2016-04-09 DIAGNOSIS — E7800 Pure hypercholesterolemia, unspecified: Secondary | ICD-10-CM | POA: Diagnosis not present

## 2016-04-09 DIAGNOSIS — R3129 Other microscopic hematuria: Secondary | ICD-10-CM | POA: Diagnosis not present

## 2016-04-16 DIAGNOSIS — Z0000 Encounter for general adult medical examination without abnormal findings: Secondary | ICD-10-CM | POA: Diagnosis not present

## 2016-04-16 DIAGNOSIS — I10 Essential (primary) hypertension: Secondary | ICD-10-CM | POA: Insufficient documentation

## 2016-04-16 DIAGNOSIS — R3129 Other microscopic hematuria: Secondary | ICD-10-CM | POA: Diagnosis not present

## 2016-04-16 DIAGNOSIS — E042 Nontoxic multinodular goiter: Secondary | ICD-10-CM | POA: Diagnosis not present

## 2016-04-16 DIAGNOSIS — E7800 Pure hypercholesterolemia, unspecified: Secondary | ICD-10-CM | POA: Diagnosis not present

## 2016-05-22 ENCOUNTER — Ambulatory Visit: Payer: Self-pay

## 2016-05-31 ENCOUNTER — Encounter: Payer: Self-pay | Admitting: Urology

## 2016-05-31 ENCOUNTER — Ambulatory Visit: Payer: PPO | Admitting: Urology

## 2016-05-31 VITALS — BP 150/73 | HR 74 | Ht 64.0 in | Wt 134.7 lb

## 2016-05-31 DIAGNOSIS — R3129 Other microscopic hematuria: Secondary | ICD-10-CM

## 2016-05-31 LAB — MICROSCOPIC EXAMINATION: Epithelial Cells (non renal): >10 /hpf — AB (ref 0–10)

## 2016-05-31 LAB — URINALYSIS, COMPLETE
BILIRUBIN UA: NEGATIVE
GLUCOSE, UA: NEGATIVE
KETONES UA: NEGATIVE
LEUKOCYTES UA: NEGATIVE
Nitrite, UA: NEGATIVE
PROTEIN UA: NEGATIVE
UUROB: 0.2 mg/dL (ref 0.2–1.0)
pH, UA: 5.5 (ref 5.0–7.5)

## 2016-05-31 NOTE — Progress Notes (Signed)
05/31/2016 2:36 PM   Tracy Andrews 1945-07-12 [id number]  Referring provider: Tracie Harrier, MD [address] [facility] [place], [place] [address]  Chief Complaint  Patient presents with  . Follow-up    microscopic hematuria     HPI: The patient is a 71 year old female with a past medical history of microscopic hematuria presents for follow-up. In August 2017 she underwent a negative formal workup for microscopic hematuria which was negative. She has no history of gross hematuria has done well since her last visit. She presents today for post-microscopic hematuria workup repeat urinalysis.    She has persistent microscopic hematuria with 3-10 red blood cells per high-power field on urinalysis today.  PMH: Past Medical History:  Diagnosis Date  . Arthritis   . Basal cell carcinoma of skin    skin  . Elevated cholesterol     Surgical History: Past Surgical History:  Procedure Laterality Date  . CESAREAN SECTION     x 2  . HYSTEROSCOPY W/D&C N/A 12/03/2014   Procedure: DILATATION AND CURETTAGE /HYSTEROSCOPY;  Surgeon: Lorette Ang, MD;  Location: [facility] ORS;  Service: Gynecology;  Laterality: N/A;  . WRIST SURGERY Left    put a pin in it    Home Medications:  Allergies as of 05/31/2016   No Known Allergies     Medication List       Accurate as of 05/31/16  2:36 PM. Always use your most recent med list.          amLODipine 2.5 MG tablet Commonly known as:  NORVASC Take by mouth.   aspirin EC 81 MG tablet Take 81 mg by mouth daily.   CALCIUM 600 600 MG Tabs tablet Generic drug:  calcium carbonate Take by mouth.   conjugated estrogens vaginal cream Commonly known as:  PREMARIN Place vaginally.   FISH OIL PO Take by mouth.   Magnesium 250 MG Tabs Take 1 tablet by mouth daily.   multivitamin capsule Take by mouth.   multivitamin with minerals Tabs tablet Take 1 tablet by mouth daily.   simvastatin 20 MG tablet Commonly  known as:  ZOCOR Take 20 mg by mouth at bedtime.   valACYclovir 500 MG tablet Commonly known as:  VALTREX Take by mouth.   vitamin B-12 500 MCG tablet Commonly known as:  CYANOCOBALAMIN Take 500 mcg by mouth daily.   Vitamin D-3 1000 units Caps Take 1 capsule by mouth daily.   Zinc Gluconate 15 MG Tabs Take by mouth.       Allergies: No Known Allergies  Family History: Family History  Problem Relation Age of Onset  . Kidney disease Neg Hx   . Bladder Cancer Neg Hx     Social History:  reports that she has never smoked. She has never used smokeless tobacco. She reports that she does not drink alcohol or use drugs.  ROS:                                        Physical Exam: BP (!) 150/73   Pulse 74   Ht 5\' 4"  (1.626 m)   Wt 134 lb 11.2 oz (61.1 kg)   BMI 23.12 kg/m   Constitutional:  Alert and oriented, No acute distress. HEENT: [place] AT, moist mucus membranes.  Trachea midline, no masses. Cardiovascular: No clubbing, cyanosis, or edema. Respiratory: Normal respiratory effort, no increased work  of breathing. GI: Abdomen is soft, nontender, nondistended, no abdominal masses GU: No CVA tenderness.  Skin: No rashes, bruises or suspicious lesions. Lymph: No cervical or inguinal adenopathy. Neurologic: Grossly intact, no focal deficits, moving all 4 extremities. Psychiatric: Normal mood and affect.  Laboratory Data: Lab Results  Component Value Date   WBC 5.5 11/23/2014   HGB 13.5 11/23/2014   HCT 40.1 11/23/2014   MCV 89.6 11/23/2014   PLT 286 11/23/2014    Lab Results  Component Value Date   CREATININE 0.70 10/28/2015    No results found for: PSA  No results found for: TESTOSTERONE  No results found for: HGBA1C  Urinalysis    Component Value Date/Time   APPEARANCEUR Clear 11/22/2015 1419   GLUCOSEU Negative 11/22/2015 1419   BILIRUBINUR Negative 11/22/2015 1419   PROTEINUR Negative 11/22/2015 1419   NITRITE Negative  11/22/2015 1419   LEUKOCYTESUR Negative 11/22/2015 1419     Assessment & Plan:    1. Microscopic hematuria Persistent after negative workup in August 2017. We'll recheck her urine in one year. If he remains persistent at 3-5 years after her negative workup, we may consider repeating a formal hematuria workup per AUA guidelines.   Return in about 1 year (around 05/31/2017).  Nickie Retort, MD  [facility] [address] [place], [place] [address] [phone]

## 2016-10-11 DIAGNOSIS — E7800 Pure hypercholesterolemia, unspecified: Secondary | ICD-10-CM | POA: Diagnosis not present

## 2016-10-11 DIAGNOSIS — R3129 Other microscopic hematuria: Secondary | ICD-10-CM | POA: Diagnosis not present

## 2016-10-11 DIAGNOSIS — Z0000 Encounter for general adult medical examination without abnormal findings: Secondary | ICD-10-CM | POA: Diagnosis not present

## 2016-10-11 DIAGNOSIS — E042 Nontoxic multinodular goiter: Secondary | ICD-10-CM | POA: Diagnosis not present

## 2016-10-11 DIAGNOSIS — I10 Essential (primary) hypertension: Secondary | ICD-10-CM | POA: Diagnosis not present

## 2016-10-18 DIAGNOSIS — Z0000 Encounter for general adult medical examination without abnormal findings: Secondary | ICD-10-CM | POA: Diagnosis not present

## 2016-10-18 DIAGNOSIS — I10 Essential (primary) hypertension: Secondary | ICD-10-CM | POA: Diagnosis not present

## 2016-10-18 DIAGNOSIS — R3129 Other microscopic hematuria: Secondary | ICD-10-CM | POA: Diagnosis not present

## 2016-10-18 DIAGNOSIS — L538 Other specified erythematous conditions: Secondary | ICD-10-CM | POA: Diagnosis not present

## 2016-10-18 DIAGNOSIS — E042 Nontoxic multinodular goiter: Secondary | ICD-10-CM | POA: Diagnosis not present

## 2016-10-18 DIAGNOSIS — E7800 Pure hypercholesterolemia, unspecified: Secondary | ICD-10-CM | POA: Diagnosis not present

## 2017-04-11 DIAGNOSIS — R3129 Other microscopic hematuria: Secondary | ICD-10-CM | POA: Diagnosis not present

## 2017-04-11 DIAGNOSIS — Z0000 Encounter for general adult medical examination without abnormal findings: Secondary | ICD-10-CM | POA: Diagnosis not present

## 2017-04-11 DIAGNOSIS — L538 Other specified erythematous conditions: Secondary | ICD-10-CM | POA: Diagnosis not present

## 2017-04-11 DIAGNOSIS — I10 Essential (primary) hypertension: Secondary | ICD-10-CM | POA: Diagnosis not present

## 2017-04-18 DIAGNOSIS — I10 Essential (primary) hypertension: Secondary | ICD-10-CM | POA: Diagnosis not present

## 2017-04-18 DIAGNOSIS — Z0000 Encounter for general adult medical examination without abnormal findings: Secondary | ICD-10-CM | POA: Diagnosis not present

## 2017-04-18 DIAGNOSIS — E7800 Pure hypercholesterolemia, unspecified: Secondary | ICD-10-CM | POA: Diagnosis not present

## 2017-04-18 DIAGNOSIS — Z1231 Encounter for screening mammogram for malignant neoplasm of breast: Secondary | ICD-10-CM | POA: Diagnosis not present

## 2017-04-18 DIAGNOSIS — R6884 Jaw pain: Secondary | ICD-10-CM | POA: Diagnosis not present

## 2017-04-18 DIAGNOSIS — M25512 Pain in left shoulder: Secondary | ICD-10-CM | POA: Diagnosis not present

## 2017-04-22 ENCOUNTER — Other Ambulatory Visit: Payer: Self-pay | Admitting: Internal Medicine

## 2017-04-22 DIAGNOSIS — Z1231 Encounter for screening mammogram for malignant neoplasm of breast: Secondary | ICD-10-CM

## 2017-04-23 DIAGNOSIS — H2513 Age-related nuclear cataract, bilateral: Secondary | ICD-10-CM | POA: Diagnosis not present

## 2017-04-23 DIAGNOSIS — H5203 Hypermetropia, bilateral: Secondary | ICD-10-CM | POA: Diagnosis not present

## 2017-04-23 DIAGNOSIS — D3131 Benign neoplasm of right choroid: Secondary | ICD-10-CM | POA: Diagnosis not present

## 2017-05-06 ENCOUNTER — Ambulatory Visit
Admission: RE | Admit: 2017-05-06 | Discharge: 2017-05-06 | Disposition: A | Discharge status: Home / Self Care | Payer: PPO | Source: Ambulatory Visit | Attending: Internal Medicine | Admitting: Internal Medicine

## 2017-05-06 DIAGNOSIS — Z1231 Encounter for screening mammogram for malignant neoplasm of breast: Secondary | ICD-10-CM | POA: Insufficient documentation

## 2017-05-30 ENCOUNTER — Encounter: Payer: Self-pay | Admitting: Urology

## 2017-05-30 ENCOUNTER — Ambulatory Visit (INDEPENDENT_AMBULATORY_CARE_PROVIDER_SITE_OTHER): Payer: PPO | Admitting: Urology

## 2017-05-30 VITALS — BP 158/75 | HR 72 | Ht 64.0 in | Wt 136.9 lb

## 2017-05-30 DIAGNOSIS — R3129 Other microscopic hematuria: Secondary | ICD-10-CM

## 2017-05-30 NOTE — Progress Notes (Signed)
05/30/2017 11:11 AM   Tracy Andrews [birth year] [id number]  Referring provider: Tracie Harrier, MD [address] [facility] [place], [place] [address]  Chief Complaint  Patient presents with  . Hematuria    HPI: The patient is a 72 year old female with a past medical history of microscopic hematuria presents for follow-up. In August 2017 she underwent a negative formal workup for microscopic hematuria which was negative. She has no history of gross hematuria has done well since her last visit. She presents today for post-microscopic hematuria workup repeat urinalysis.  She had persistent microscopic hematuria with 3-10 red blood cells per high-power field on urinalysis in March 2018.  However, on today's urinalysis her microscopic hematuria has resolved.  She has no complaints over the last year.  No nephrolithiasis.  No gross hematuria.  No UTIs.  No complaints in regards to voiding.   PMH: Past Medical History:  Diagnosis Date  . Arthritis   . Basal cell carcinoma of skin    skin  . Elevated cholesterol     Surgical History: Past Surgical History:  Procedure Laterality Date  . CESAREAN SECTION     x 2  . HYSTEROSCOPY W/D&C N/A 12/03/2014   Procedure: DILATATION AND CURETTAGE /HYSTEROSCOPY;  Surgeon: Lorette Ang, MD;  Location: [facility] ORS;  Service: Gynecology;  Laterality: N/A;  . WRIST SURGERY Left    put a pin in it    Home Medications:  Allergies as of 05/30/2017   No Known Allergies     Medication List        Accurate as of 05/30/17 11:11 AM. Always use your most recent med list.          amLODipine 2.5 MG tablet Commonly known as:  NORVASC Take by mouth.   aspirin EC 81 MG tablet Take 81 mg by mouth daily.   CALCIUM 600 600 MG Tabs tablet Generic drug:  calcium carbonate Take by mouth.   FISH OIL PO Take by mouth.   Magnesium 250 MG Tabs Take 1 tablet by mouth daily.   multivitamin with minerals Tabs tablet Take 1  tablet by mouth daily.   simvastatin 20 MG tablet Commonly known as:  ZOCOR Take 20 mg by mouth at bedtime.   valACYclovir 500 MG tablet Commonly known as:  VALTREX Take by mouth.   vitamin B-12 500 MCG tablet Commonly known as:  CYANOCOBALAMIN Take 500 mcg by mouth daily.   Vitamin D-3 1000 units Caps Take 1 capsule by mouth daily.   Zinc Gluconate 15 MG Tabs Take by mouth.       Allergies: No Known Allergies  Family History: Family History  Problem Relation Age of Onset  . Kidney disease Neg Hx   . Bladder Cancer Neg Hx   . Breast cancer Neg Hx     Social History:  reports that  has never smoked. she has never used smokeless tobacco. She reports that she does not drink alcohol or use drugs.  ROS: UROLOGY Frequent Urination?: No Hard to postpone urination?: No Burning/pain with urination?: No Get up at night to urinate?: No Leakage of urine?: No Urine stream starts and stops?: No Trouble starting stream?: No Do you have to strain to urinate?: No Blood in urine?: Yes Urinary tract infection?: No Sexually transmitted disease?: No Injury to kidneys or bladder?: No Painful intercourse?: No Weak stream?: No Currently pregnant?: No Vaginal bleeding?: No Last menstrual period?: n  Gastrointestinal Nausea?: No Vomiting?: No Indigestion/heartburn?: No Diarrhea?:  No Constipation?: No  Constitutional Fever: No Night sweats?: No Weight loss?: No Fatigue?: No  Skin Skin rash/lesions?: No Itching?: No  Eyes Blurred vision?: No Double vision?: No  Ears/Nose/Throat Sore throat?: No Sinus problems?: No  Hematologic/Lymphatic Swollen glands?: No Easy bruising?: No  Cardiovascular Leg swelling?: No Chest pain?: No  Respiratory Cough?: No Shortness of breath?: No  Endocrine Excessive thirst?: No  Musculoskeletal Back pain?: No Joint pain?: No  Neurological Headaches?: No Dizziness?: No  Psychologic Depression?: No Anxiety?:  No  Physical Exam: BP (!) 158/75 (BP Location: Right Arm, Patient Position: Sitting, Cuff Size: Normal)   Pulse 72   Ht 5\' 4"  (1.626 m)   Wt 136 lb 14.4 oz (62.1 kg)   BMI 23.50 kg/m   Constitutional:  Alert and oriented, No acute distress. HEENT: [place] AT, moist mucus membranes.  Trachea midline, no masses. Cardiovascular: No clubbing, cyanosis, or edema. Respiratory: Normal respiratory effort, no increased work of breathing. GI: Abdomen is soft, nontender, nondistended, no abdominal masses GU: No CVA tenderness.  Skin: No rashes, bruises or suspicious lesions. Lymph: No cervical or inguinal adenopathy. Neurologic: Grossly intact, no focal deficits, moving all 4 extremities. Psychiatric: Normal mood and affect.  Laboratory Data: Lab Results  Component Value Date   WBC 5.5 11/23/2014   HGB 13.5 11/23/2014   HCT 40.1 11/23/2014   MCV 89.6 11/23/2014   PLT 286 11/23/2014    Lab Results  Component Value Date   CREATININE 0.70 10/28/2015    No results found for: PSA  No results found for: TESTOSTERONE  No results found for: HGBA1C  Urinalysis    Component Value Date/Time   APPEARANCEUR Clear 05/31/2016 1417   GLUCOSEU Negative 05/31/2016 1417   BILIRUBINUR Negative 05/31/2016 1417   PROTEINUR Negative 05/31/2016 1417   NITRITE Negative 05/31/2016 1417   LEUKOCYTESUR Negative 05/31/2016 1417    Assessment & Plan:    1. Microscopic hematuria Her microscopic hematuria has resolved after persistence in March 2018 after a negative workup in August 2017.  We will check her urinalysis one more time to ensure continued resolution in 1 year.  If that is normal, she can follow-up as needed.  Return in about 1 year (around 05/31/2018) for repeat U/A.  Nickie Retort, MD  [facility] [address], [place], [place] [address] [phone]

## 2017-05-31 LAB — MICROSCOPIC EXAMINATION
BACTERIA UA: NONE SEEN
WBC, UA: NONE SEEN /hpf (ref 0–?)

## 2017-05-31 LAB — URINALYSIS, COMPLETE
Bilirubin, UA: NEGATIVE
GLUCOSE, UA: NEGATIVE
KETONES UA: NEGATIVE
Leukocytes, UA: NEGATIVE
NITRITE UA: NEGATIVE
Protein, UA: NEGATIVE
SPEC GRAV UA: 1.025 (ref 1.005–1.030)
Urobilinogen, Ur: 0.2 mg/dL (ref 0.2–1.0)
pH, UA: 5 (ref 5.0–7.5)

## 2017-09-17 DIAGNOSIS — I8312 Varicose veins of left lower extremity with inflammation: Secondary | ICD-10-CM | POA: Diagnosis not present

## 2017-09-17 DIAGNOSIS — I8311 Varicose veins of right lower extremity with inflammation: Secondary | ICD-10-CM | POA: Diagnosis not present

## 2017-10-08 DIAGNOSIS — I8312 Varicose veins of left lower extremity with inflammation: Secondary | ICD-10-CM | POA: Diagnosis not present

## 2017-10-08 DIAGNOSIS — I8311 Varicose veins of right lower extremity with inflammation: Secondary | ICD-10-CM | POA: Diagnosis not present

## 2017-10-14 DIAGNOSIS — I10 Essential (primary) hypertension: Secondary | ICD-10-CM | POA: Diagnosis not present

## 2017-10-14 DIAGNOSIS — M25512 Pain in left shoulder: Secondary | ICD-10-CM | POA: Diagnosis not present

## 2017-10-14 DIAGNOSIS — R6884 Jaw pain: Secondary | ICD-10-CM | POA: Diagnosis not present

## 2017-10-14 DIAGNOSIS — Z0000 Encounter for general adult medical examination without abnormal findings: Secondary | ICD-10-CM | POA: Diagnosis not present

## 2017-10-14 DIAGNOSIS — E7800 Pure hypercholesterolemia, unspecified: Secondary | ICD-10-CM | POA: Diagnosis not present

## 2017-10-14 DIAGNOSIS — Z1231 Encounter for screening mammogram for malignant neoplasm of breast: Secondary | ICD-10-CM | POA: Diagnosis not present

## 2017-10-17 DIAGNOSIS — I10 Essential (primary) hypertension: Secondary | ICD-10-CM | POA: Diagnosis not present

## 2017-10-17 DIAGNOSIS — Z0000 Encounter for general adult medical examination without abnormal findings: Secondary | ICD-10-CM | POA: Diagnosis not present

## 2017-10-17 DIAGNOSIS — G2581 Restless legs syndrome: Secondary | ICD-10-CM | POA: Diagnosis not present

## 2017-10-17 DIAGNOSIS — E042 Nontoxic multinodular goiter: Secondary | ICD-10-CM | POA: Diagnosis not present

## 2017-10-18 DIAGNOSIS — I8311 Varicose veins of right lower extremity with inflammation: Secondary | ICD-10-CM | POA: Diagnosis not present

## 2017-10-18 DIAGNOSIS — I8312 Varicose veins of left lower extremity with inflammation: Secondary | ICD-10-CM | POA: Diagnosis not present

## 2017-11-19 DIAGNOSIS — I8311 Varicose veins of right lower extremity with inflammation: Secondary | ICD-10-CM | POA: Diagnosis not present

## 2017-11-19 DIAGNOSIS — I8312 Varicose veins of left lower extremity with inflammation: Secondary | ICD-10-CM | POA: Diagnosis not present

## 2017-12-03 DIAGNOSIS — I8312 Varicose veins of left lower extremity with inflammation: Secondary | ICD-10-CM | POA: Diagnosis not present

## 2017-12-17 DIAGNOSIS — I8311 Varicose veins of right lower extremity with inflammation: Secondary | ICD-10-CM | POA: Diagnosis not present

## 2017-12-31 DIAGNOSIS — I8312 Varicose veins of left lower extremity with inflammation: Secondary | ICD-10-CM | POA: Diagnosis not present

## 2018-01-15 DIAGNOSIS — I8311 Varicose veins of right lower extremity with inflammation: Secondary | ICD-10-CM | POA: Diagnosis not present

## 2018-01-28 DIAGNOSIS — I8312 Varicose veins of left lower extremity with inflammation: Secondary | ICD-10-CM | POA: Diagnosis not present

## 2018-03-14 DIAGNOSIS — J069 Acute upper respiratory infection, unspecified: Secondary | ICD-10-CM | POA: Diagnosis not present

## 2018-03-14 DIAGNOSIS — J40 Bronchitis, not specified as acute or chronic: Secondary | ICD-10-CM | POA: Diagnosis not present

## 2018-04-16 DIAGNOSIS — G2581 Restless legs syndrome: Secondary | ICD-10-CM | POA: Diagnosis not present

## 2018-04-16 DIAGNOSIS — Z0000 Encounter for general adult medical examination without abnormal findings: Secondary | ICD-10-CM | POA: Diagnosis not present

## 2018-04-16 DIAGNOSIS — I10 Essential (primary) hypertension: Secondary | ICD-10-CM | POA: Diagnosis not present

## 2018-04-16 DIAGNOSIS — E042 Nontoxic multinodular goiter: Secondary | ICD-10-CM | POA: Diagnosis not present

## 2018-04-23 DIAGNOSIS — E042 Nontoxic multinodular goiter: Secondary | ICD-10-CM | POA: Diagnosis not present

## 2018-04-23 DIAGNOSIS — I10 Essential (primary) hypertension: Secondary | ICD-10-CM | POA: Diagnosis not present

## 2018-04-23 DIAGNOSIS — Z0000 Encounter for general adult medical examination without abnormal findings: Secondary | ICD-10-CM | POA: Diagnosis not present

## 2018-04-23 DIAGNOSIS — Z780 Asymptomatic menopausal state: Secondary | ICD-10-CM | POA: Diagnosis not present

## 2018-04-23 DIAGNOSIS — E7800 Pure hypercholesterolemia, unspecified: Secondary | ICD-10-CM | POA: Diagnosis not present

## 2018-05-06 DIAGNOSIS — Z780 Asymptomatic menopausal state: Secondary | ICD-10-CM | POA: Diagnosis not present

## 2018-06-02 ENCOUNTER — Ambulatory Visit: Payer: PPO | Admitting: Urology

## 2018-06-02 ENCOUNTER — Encounter: Payer: Self-pay | Admitting: Urology

## 2018-06-02 VITALS — BP 143/69 | HR 69 | Ht 64.0 in | Wt 137.5 lb

## 2018-06-02 DIAGNOSIS — R3129 Other microscopic hematuria: Secondary | ICD-10-CM

## 2018-06-02 LAB — URINALYSIS, COMPLETE
Bilirubin, UA: NEGATIVE
GLUCOSE, UA: NEGATIVE
KETONES UA: NEGATIVE
Leukocytes, UA: NEGATIVE
NITRITE UA: NEGATIVE
Protein, UA: NEGATIVE
SPEC GRAV UA: 1.025 (ref 1.005–1.030)
UUROB: 0.2 mg/dL (ref 0.2–1.0)
pH, UA: 6.5 (ref 5.0–7.5)

## 2018-06-02 LAB — MICROSCOPIC EXAMINATION
Bacteria, UA: NONE SEEN
Epithelial Cells (non renal): NONE SEEN /hpf (ref 0–10)
WBC, UA: NONE SEEN /hpf (ref 0–5)

## 2018-06-02 NOTE — Progress Notes (Signed)
06/02/2018 8:58 AM   Tracy Andrews 1946/03/24 [id number]  Referring provider: Tracie Harrier, MD [address] [facility] [place], [place] [address]  Chief Complaint  Patient presents with  . Hematuria    HPI: 73 year old female previously seen by [facility] and Dr. Pilar Jarvis for asymptomatic microscopic hematuria.  A CT urogram and 2017 showed no significant upper tract abnormalities and cystoscopy was unremarkable.  She was seen last year and her urinalysis was unremarkable.  She presents today for annual follow-up.  She denies flank, abdominal or pelvic pain.  She denies gross hematuria.  Urinalysis at [facility] January 2020 showed no RBCs on microscopy.   PMH: Past Medical History:  Diagnosis Date  . Arthritis   . Basal cell carcinoma of skin    skin  . Elevated cholesterol     Surgical History: Past Surgical History:  Procedure Laterality Date  . CESAREAN SECTION     x 2  . HYSTEROSCOPY W/D&C N/A 12/03/2014   Procedure: DILATATION AND CURETTAGE /HYSTEROSCOPY;  Surgeon: Lorette Ang, MD;  Location: [facility] ORS;  Service: Gynecology;  Laterality: N/A;  . WRIST SURGERY Left    put a pin in it    Home Medications:  Allergies as of 06/02/2018   No Known Allergies     Medication List       Accurate as of June 02, 2018  8:58 AM. Always use your most recent med list.        alendronate 70 MG tablet Commonly known as:  FOSAMAX TAKE 1 TABLET BY MOUTH EVERY 7 DAYS TAKE FIRST THING IN THE MORNING WITH A FULL GLASS OF DISTILLED WATER. DO NOT EAT OR DRINK ANYTHING ELSE   amLODipine 2.5 MG tablet Commonly known as:  NORVASC Take 2.5 mg by mouth 2 (two) times daily.   aspirin EC 81 MG tablet Take 81 mg by mouth daily.   Calcium 600 600 MG Tabs tablet Generic drug:  calcium carbonate Take by mouth.   calcium carbonate 600 MG Tabs tablet Commonly known as:  OS-CAL Take 600 mg by mouth 2 (two) times daily.   FISH OIL PO Take by mouth.   fluticasone 50 MCG/ACT nasal spray Commonly known as:  FLONASE Place into the nose.   Magnesium 250 MG Tabs Take 1 tablet by mouth daily.   multivitamin with minerals Tabs tablet Take 1 tablet by mouth daily.   simvastatin 20 MG tablet Commonly known as:  ZOCOR Take 20 mg by mouth at bedtime.   valACYclovir 500 MG tablet Commonly known as:  VALTREX Take by mouth.   vitamin B-12 500 MCG tablet Commonly known as:  CYANOCOBALAMIN Take 500 mcg by mouth daily.   Vitamin D-3 25 MCG (1000 UT) Caps Take 1 capsule by mouth daily.   Zinc Gluconate 15 MG Tabs Take by mouth.       Allergies: No Known Allergies  Family History: Family History  Problem Relation Age of Onset  . Kidney disease Neg Hx   . Bladder Cancer Neg Hx   . Breast cancer Neg Hx     Social History:  reports that she has never smoked. She has never used smokeless tobacco. She reports that she does not drink alcohol or use drugs.  ROS: UROLOGY Frequent Urination?: No Hard to postpone urination?: No Burning/pain with urination?: No Get up at night to urinate?: No Leakage of urine?: No Urine stream starts and stops?: No Trouble starting stream?: No Do you have to strain  to urinate?: No Blood in urine?: No Urinary tract infection?: No Sexually transmitted disease?: No Injury to kidneys or bladder?: No Painful intercourse?: No Weak stream?: No Currently pregnant?: No Vaginal bleeding?: No Last menstrual period?: Postmenopausal  Gastrointestinal Nausea?: No Vomiting?: No Indigestion/heartburn?: No Diarrhea?: No Constipation?: No  Constitutional Fever: No Night sweats?: No Weight loss?: No Fatigue?: No  Skin Skin rash/lesions?: No Itching?: No  Eyes Blurred vision?: No Double vision?: No  Ears/Nose/Throat Sore throat?: No Sinus problems?: No  Hematologic/Lymphatic Swollen glands?: No Easy bruising?: No  Cardiovascular Leg swelling?: No Chest pain?:  No  Respiratory Cough?: No Shortness of breath?: No  Endocrine Excessive thirst?: No  Musculoskeletal Back pain?: No Joint pain?: No  Neurological Headaches?: No Dizziness?: No  Psychologic Depression?: No Anxiety?: No  Physical Exam: BP (!) 143/69 (BP Location: Left Arm, Patient Position: Sitting, Cuff Size: Normal)   Pulse 69   Ht 5\' 4"  (1.626 m)   Wt 137 lb 8 oz (62.4 kg)   BMI 23.60 kg/m   Constitutional:  Alert and oriented, No acute distress. HEENT: [place] AT, moist mucus membranes.  Trachea midline, no masses. Cardiovascular: No clubbing, cyanosis, or edema. Respiratory: Normal respiratory effort, no increased work of breathing. Skin: No rashes, bruises or suspicious lesions. Neurologic: Grossly intact, no focal deficits, moving all 4 extremities. Psychiatric: Normal mood and affect.  Laboratory Data:  Urinalysis: Microscopy negative for RBCs  Assessment & Plan:   73 year old female with previous negative evaluation for asymptomatic microhematuria.  Urinalysis today  Follow-up as needed.  She will have an annual urinalysis with Dr. Ginette Pitman and return here as needed for significant increase in microhematuria or development of gross hematuria.   Abbie Sons, [place] [address], [place], [place] [address] [phone]

## 2018-11-10 DIAGNOSIS — Z0000 Encounter for general adult medical examination without abnormal findings: Secondary | ICD-10-CM | POA: Diagnosis not present

## 2018-11-10 DIAGNOSIS — Z780 Asymptomatic menopausal state: Secondary | ICD-10-CM | POA: Diagnosis not present

## 2018-11-10 DIAGNOSIS — I10 Essential (primary) hypertension: Secondary | ICD-10-CM | POA: Diagnosis not present

## 2018-11-10 DIAGNOSIS — E7800 Pure hypercholesterolemia, unspecified: Secondary | ICD-10-CM | POA: Diagnosis not present

## 2018-11-11 ENCOUNTER — Other Ambulatory Visit: Payer: Self-pay | Admitting: Internal Medicine

## 2018-11-11 DIAGNOSIS — Z1211 Encounter for screening for malignant neoplasm of colon: Secondary | ICD-10-CM | POA: Diagnosis not present

## 2018-11-11 DIAGNOSIS — Z1239 Encounter for other screening for malignant neoplasm of breast: Secondary | ICD-10-CM | POA: Diagnosis not present

## 2018-11-11 DIAGNOSIS — I10 Essential (primary) hypertension: Secondary | ICD-10-CM | POA: Diagnosis not present

## 2018-11-11 DIAGNOSIS — E7800 Pure hypercholesterolemia, unspecified: Secondary | ICD-10-CM | POA: Diagnosis not present

## 2018-11-11 DIAGNOSIS — Z1231 Encounter for screening mammogram for malignant neoplasm of breast: Secondary | ICD-10-CM

## 2018-11-11 DIAGNOSIS — M810 Age-related osteoporosis without current pathological fracture: Secondary | ICD-10-CM | POA: Diagnosis not present

## 2018-11-21 DIAGNOSIS — D3131 Benign neoplasm of right choroid: Secondary | ICD-10-CM | POA: Diagnosis not present

## 2018-11-21 DIAGNOSIS — H2513 Age-related nuclear cataract, bilateral: Secondary | ICD-10-CM | POA: Diagnosis not present

## 2018-12-01 DIAGNOSIS — I10 Essential (primary) hypertension: Secondary | ICD-10-CM | POA: Diagnosis not present

## 2018-12-01 DIAGNOSIS — H8113 Benign paroxysmal vertigo, bilateral: Secondary | ICD-10-CM | POA: Diagnosis not present

## 2018-12-01 DIAGNOSIS — R42 Dizziness and giddiness: Secondary | ICD-10-CM | POA: Diagnosis not present

## 2018-12-01 DIAGNOSIS — R112 Nausea with vomiting, unspecified: Secondary | ICD-10-CM | POA: Diagnosis not present

## 2018-12-10 DIAGNOSIS — I10 Essential (primary) hypertension: Secondary | ICD-10-CM | POA: Diagnosis not present

## 2018-12-10 DIAGNOSIS — R739 Hyperglycemia, unspecified: Secondary | ICD-10-CM | POA: Diagnosis not present

## 2018-12-10 DIAGNOSIS — R42 Dizziness and giddiness: Secondary | ICD-10-CM | POA: Diagnosis not present

## 2018-12-11 DIAGNOSIS — Z86010 Personal history of colonic polyps: Secondary | ICD-10-CM | POA: Diagnosis not present

## 2018-12-16 ENCOUNTER — Ambulatory Visit
Admission: RE | Admit: 2018-12-16 | Discharge: 2018-12-16 | Disposition: A | Discharge status: Home / Self Care | Payer: PPO | Source: Ambulatory Visit | Attending: Internal Medicine | Admitting: Internal Medicine

## 2018-12-16 DIAGNOSIS — R42 Dizziness and giddiness: Secondary | ICD-10-CM | POA: Diagnosis not present

## 2018-12-16 DIAGNOSIS — Z1231 Encounter for screening mammogram for malignant neoplasm of breast: Secondary | ICD-10-CM | POA: Diagnosis not present

## 2018-12-16 DIAGNOSIS — R739 Hyperglycemia, unspecified: Secondary | ICD-10-CM | POA: Diagnosis not present

## 2018-12-16 DIAGNOSIS — I10 Essential (primary) hypertension: Secondary | ICD-10-CM | POA: Diagnosis not present

## 2019-04-01 ENCOUNTER — Ambulatory Visit: Admit: 2019-04-01 | Payer: PPO | Admitting: Internal Medicine

## 2019-04-01 SURGERY — COLONOSCOPY WITH PROPOFOL
Anesthesia: General

## 2019-05-07 DIAGNOSIS — E7800 Pure hypercholesterolemia, unspecified: Secondary | ICD-10-CM | POA: Diagnosis not present

## 2019-05-07 DIAGNOSIS — M810 Age-related osteoporosis without current pathological fracture: Secondary | ICD-10-CM | POA: Diagnosis not present

## 2019-05-07 DIAGNOSIS — I10 Essential (primary) hypertension: Secondary | ICD-10-CM | POA: Diagnosis not present

## 2019-05-22 DIAGNOSIS — E7800 Pure hypercholesterolemia, unspecified: Secondary | ICD-10-CM | POA: Diagnosis not present

## 2019-05-22 DIAGNOSIS — E042 Nontoxic multinodular goiter: Secondary | ICD-10-CM | POA: Diagnosis not present

## 2019-05-22 DIAGNOSIS — I10 Essential (primary) hypertension: Secondary | ICD-10-CM | POA: Diagnosis not present

## 2019-05-22 DIAGNOSIS — Z0000 Encounter for general adult medical examination without abnormal findings: Secondary | ICD-10-CM | POA: Diagnosis not present

## 2019-06-02 DIAGNOSIS — J40 Bronchitis, not specified as acute or chronic: Secondary | ICD-10-CM | POA: Diagnosis not present

## 2019-06-02 DIAGNOSIS — U071 COVID-19: Secondary | ICD-10-CM | POA: Diagnosis not present

## 2019-06-02 DIAGNOSIS — R110 Nausea: Secondary | ICD-10-CM | POA: Diagnosis not present

## 2019-06-02 DIAGNOSIS — R0602 Shortness of breath: Secondary | ICD-10-CM | POA: Diagnosis not present

## 2019-06-02 DIAGNOSIS — R531 Weakness: Secondary | ICD-10-CM | POA: Diagnosis not present

## 2019-06-02 DIAGNOSIS — Z20822 Contact with and (suspected) exposure to covid-19: Secondary | ICD-10-CM | POA: Diagnosis not present

## 2019-11-12 DIAGNOSIS — I10 Essential (primary) hypertension: Secondary | ICD-10-CM | POA: Diagnosis not present

## 2019-11-12 DIAGNOSIS — Z0000 Encounter for general adult medical examination without abnormal findings: Secondary | ICD-10-CM | POA: Diagnosis not present

## 2019-11-12 DIAGNOSIS — E042 Nontoxic multinodular goiter: Secondary | ICD-10-CM | POA: Diagnosis not present

## 2019-11-12 DIAGNOSIS — E7800 Pure hypercholesterolemia, unspecified: Secondary | ICD-10-CM | POA: Diagnosis not present

## 2019-11-12 DIAGNOSIS — R8290 Unspecified abnormal findings in urine: Secondary | ICD-10-CM | POA: Diagnosis not present

## 2019-11-19 DIAGNOSIS — N390 Urinary tract infection, site not specified: Secondary | ICD-10-CM | POA: Diagnosis not present

## 2019-11-19 DIAGNOSIS — I10 Essential (primary) hypertension: Secondary | ICD-10-CM | POA: Diagnosis not present

## 2019-11-19 DIAGNOSIS — E7800 Pure hypercholesterolemia, unspecified: Secondary | ICD-10-CM | POA: Diagnosis not present

## 2019-11-19 DIAGNOSIS — E042 Nontoxic multinodular goiter: Secondary | ICD-10-CM | POA: Diagnosis not present

## 2019-11-19 DIAGNOSIS — R457 State of emotional shock and stress, unspecified: Secondary | ICD-10-CM | POA: Diagnosis not present

## 2019-11-19 DIAGNOSIS — Z8616 Personal history of COVID-19: Secondary | ICD-10-CM | POA: Diagnosis not present

## 2019-11-19 DIAGNOSIS — R319 Hematuria, unspecified: Secondary | ICD-10-CM | POA: Diagnosis not present

## 2020-01-19 DIAGNOSIS — R457 State of emotional shock and stress, unspecified: Secondary | ICD-10-CM | POA: Diagnosis not present

## 2020-01-19 DIAGNOSIS — N390 Urinary tract infection, site not specified: Secondary | ICD-10-CM | POA: Diagnosis not present

## 2020-01-19 DIAGNOSIS — R319 Hematuria, unspecified: Secondary | ICD-10-CM | POA: Diagnosis not present

## 2020-01-19 DIAGNOSIS — Z8616 Personal history of COVID-19: Secondary | ICD-10-CM | POA: Diagnosis not present

## 2020-01-19 DIAGNOSIS — E042 Nontoxic multinodular goiter: Secondary | ICD-10-CM | POA: Diagnosis not present

## 2020-01-19 DIAGNOSIS — I10 Essential (primary) hypertension: Secondary | ICD-10-CM | POA: Diagnosis not present

## 2020-01-19 DIAGNOSIS — E7800 Pure hypercholesterolemia, unspecified: Secondary | ICD-10-CM | POA: Diagnosis not present

## 2020-02-03 DIAGNOSIS — D3131 Benign neoplasm of right choroid: Secondary | ICD-10-CM | POA: Diagnosis not present

## 2020-02-03 DIAGNOSIS — H2513 Age-related nuclear cataract, bilateral: Secondary | ICD-10-CM | POA: Diagnosis not present

## 2020-02-03 DIAGNOSIS — H5203 Hypermetropia, bilateral: Secondary | ICD-10-CM | POA: Diagnosis not present

## 2020-03-09 DIAGNOSIS — R300 Dysuria: Secondary | ICD-10-CM | POA: Diagnosis not present

## 2020-03-09 DIAGNOSIS — R350 Frequency of micturition: Secondary | ICD-10-CM | POA: Diagnosis not present

## 2020-05-18 DIAGNOSIS — I10 Essential (primary) hypertension: Secondary | ICD-10-CM | POA: Diagnosis not present

## 2020-05-18 DIAGNOSIS — R457 State of emotional shock and stress, unspecified: Secondary | ICD-10-CM | POA: Diagnosis not present

## 2020-05-18 DIAGNOSIS — N390 Urinary tract infection, site not specified: Secondary | ICD-10-CM | POA: Diagnosis not present

## 2020-05-18 DIAGNOSIS — Z8616 Personal history of COVID-19: Secondary | ICD-10-CM | POA: Diagnosis not present

## 2020-05-18 DIAGNOSIS — E7800 Pure hypercholesterolemia, unspecified: Secondary | ICD-10-CM | POA: Diagnosis not present

## 2020-05-18 DIAGNOSIS — E042 Nontoxic multinodular goiter: Secondary | ICD-10-CM | POA: Diagnosis not present

## 2020-05-18 DIAGNOSIS — R319 Hematuria, unspecified: Secondary | ICD-10-CM | POA: Diagnosis not present

## 2020-05-25 ENCOUNTER — Other Ambulatory Visit: Payer: Self-pay | Admitting: Internal Medicine

## 2020-05-25 DIAGNOSIS — R42 Dizziness and giddiness: Secondary | ICD-10-CM | POA: Diagnosis not present

## 2020-05-25 DIAGNOSIS — Z1231 Encounter for screening mammogram for malignant neoplasm of breast: Secondary | ICD-10-CM | POA: Diagnosis not present

## 2020-05-25 DIAGNOSIS — Z86010 Personal history of colonic polyps: Secondary | ICD-10-CM | POA: Diagnosis not present

## 2020-05-25 DIAGNOSIS — Z0000 Encounter for general adult medical examination without abnormal findings: Secondary | ICD-10-CM | POA: Diagnosis not present

## 2020-05-25 DIAGNOSIS — E7800 Pure hypercholesterolemia, unspecified: Secondary | ICD-10-CM | POA: Diagnosis not present

## 2020-05-25 DIAGNOSIS — I10 Essential (primary) hypertension: Secondary | ICD-10-CM | POA: Diagnosis not present

## 2020-05-25 DIAGNOSIS — E042 Nontoxic multinodular goiter: Secondary | ICD-10-CM | POA: Diagnosis not present

## 2020-05-25 DIAGNOSIS — R3129 Other microscopic hematuria: Secondary | ICD-10-CM | POA: Diagnosis not present

## 2020-07-01 ENCOUNTER — Other Ambulatory Visit: Payer: Self-pay

## 2020-07-01 ENCOUNTER — Ambulatory Visit
Admission: RE | Admit: 2020-07-01 | Discharge: 2020-07-01 | Disposition: A | Discharge status: Home / Self Care | Payer: PPO | Source: Ambulatory Visit | Attending: Internal Medicine | Admitting: Internal Medicine

## 2020-07-01 DIAGNOSIS — Z1231 Encounter for screening mammogram for malignant neoplasm of breast: Secondary | ICD-10-CM | POA: Diagnosis not present

## 2020-08-16 DIAGNOSIS — Z86010 Personal history of colonic polyps: Secondary | ICD-10-CM | POA: Diagnosis not present

## 2020-11-24 ENCOUNTER — Encounter: Payer: Self-pay | Admitting: *Deleted

## 2020-11-25 ENCOUNTER — Encounter: Payer: Self-pay | Admitting: *Deleted

## 2020-11-25 ENCOUNTER — Ambulatory Visit: Payer: PPO | Admitting: Registered Nurse

## 2020-11-25 ENCOUNTER — Ambulatory Visit
Admission: RE | Admit: 2020-11-25 | Discharge: 2020-11-25 | Disposition: A | Discharge status: Home / Self Care | Payer: PPO | Attending: Gastroenterology | Admitting: Gastroenterology

## 2020-11-25 ENCOUNTER — Encounter
Admission: RE | Disposition: A | Discharge status: Home / Self Care | Payer: Self-pay | Source: Home / Self Care | Attending: Gastroenterology

## 2020-11-25 ENCOUNTER — Other Ambulatory Visit: Payer: Self-pay

## 2020-11-25 DIAGNOSIS — Z7983 Long term (current) use of bisphosphonates: Secondary | ICD-10-CM | POA: Insufficient documentation

## 2020-11-25 DIAGNOSIS — Z79899 Other long term (current) drug therapy: Secondary | ICD-10-CM | POA: Diagnosis not present

## 2020-11-25 DIAGNOSIS — Z85828 Personal history of other malignant neoplasm of skin: Secondary | ICD-10-CM | POA: Insufficient documentation

## 2020-11-25 DIAGNOSIS — Z7982 Long term (current) use of aspirin: Secondary | ICD-10-CM | POA: Insufficient documentation

## 2020-11-25 DIAGNOSIS — K640 First degree hemorrhoids: Secondary | ICD-10-CM | POA: Insufficient documentation

## 2020-11-25 DIAGNOSIS — K649 Unspecified hemorrhoids: Secondary | ICD-10-CM | POA: Diagnosis not present

## 2020-11-25 DIAGNOSIS — Z1211 Encounter for screening for malignant neoplasm of colon: Secondary | ICD-10-CM | POA: Insufficient documentation

## 2020-11-25 DIAGNOSIS — I10 Essential (primary) hypertension: Secondary | ICD-10-CM | POA: Diagnosis not present

## 2020-11-25 DIAGNOSIS — K5730 Diverticulosis of large intestine without perforation or abscess without bleeding: Secondary | ICD-10-CM | POA: Insufficient documentation

## 2020-11-25 DIAGNOSIS — Z86010 Personal history of colonic polyps: Secondary | ICD-10-CM | POA: Diagnosis not present

## 2020-11-25 HISTORY — DX: Personal history of colon polyps, unspecified: Z86.0100

## 2020-11-25 HISTORY — PX: COLONOSCOPY WITH PROPOFOL: SHX5780

## 2020-11-25 HISTORY — DX: Personal history of colonic polyps: Z86.010

## 2020-11-25 HISTORY — DX: Vitamin D deficiency, unspecified: E55.9

## 2020-11-25 HISTORY — DX: Herpesviral infection of urogenital system, unspecified: A60.00

## 2020-11-25 HISTORY — DX: Age-related osteoporosis without current pathological fracture: M81.0

## 2020-11-25 SURGERY — COLONOSCOPY WITH PROPOFOL
Anesthesia: General

## 2020-11-25 MED ORDER — LIDOCAINE HCL (CARDIAC) PF 100 MG/5ML IV SOSY
PREFILLED_SYRINGE | INTRAVENOUS | Status: DC | PRN
  Administered 2020-11-25: 80 mg via INTRAVENOUS

## 2020-11-25 MED ORDER — SODIUM CHLORIDE 0.9 % IV SOLN: INTRAVENOUS | Status: DC

## 2020-11-25 MED ORDER — PROPOFOL 500 MG/50ML IV EMUL
INTRAVENOUS | Status: DC | PRN
  Administered 2020-11-25: 150 ug/kg/min via INTRAVENOUS

## 2020-11-25 MED ORDER — PROPOFOL 10 MG/ML IV BOLUS
INTRAVENOUS | Status: DC | PRN
  Administered 2020-11-25: 60 mg via INTRAVENOUS

## 2020-11-25 NOTE — H&P (Signed)
Outpatient short stay form Pre-procedure 11/25/2020  Lesly Rubenstein, MD  Primary Physician: Tracie Harrier, MD  Reason for visit:  Surveillance colonoscopy  History of present illness:   75 y/o lady with history of hypertension here for surveillance colonoscopy. Last colonoscopy was 7 years ago and was overall normal. Had adenomatous polyps on colonoscopy done before that one. No blood thinners except aspirin. History of c-sections.    Current Facility-Administered Medications:    0.9 %  sodium chloride infusion, , Intravenous, Continuous, James Lafalce, Hilton Cork, MD, Last Rate: 20 mL/hr at 11/25/20 0941, New Bag at 11/25/20 0941  Medications Prior to Admission  Medication Sig Dispense Refill Last Dose   amLODipine (NORVASC) 2.5 MG tablet Take 2.5 mg by mouth 2 (two) times daily.   11/24/2020 at 0800   aspirin EC 81 MG tablet Take 81 mg by mouth daily.   Past Week   calcium carbonate (OS-CAL) 600 MG TABS tablet Take by mouth.   11/24/2020 at 0800   calcium carbonate (OS-CAL) 600 MG TABS tablet Take 600 mg by mouth 2 (two) times daily.   11/24/2020 at 0800   Cholecalciferol (VITAMIN D-3) 1000 UNITS CAPS Take 1 capsule by mouth daily.   11/24/2020 at 0800   Magnesium 250 MG TABS Take 1 tablet by mouth daily.   11/24/2020 at 0800   Multiple Vitamin (MULTIVITAMIN WITH MINERALS) TABS tablet Take 1 tablet by mouth daily.   11/24/2020 at 0800   Omega-3 Fatty Acids (FISH OIL PO) Take by mouth.   Past Week   simvastatin (ZOCOR) 20 MG tablet Take 20 mg by mouth at bedtime.   11/24/2020 at 0800   vitamin B-12 (CYANOCOBALAMIN) 500 MCG tablet Take 500 mcg by mouth daily.   11/24/2020 at 0800   Zinc Gluconate 15 MG TABS Take by mouth.   11/24/2020 at 0800   alendronate (FOSAMAX) 70 MG tablet TAKE 1 TABLET BY MOUTH EVERY 7 DAYS TAKE FIRST THING IN THE MORNING WITH A FULL GLASS OF DISTILLED WATER. DO NOT EAT OR DRINK ANYTHING ELSE      fluticasone (FLONASE) 50 MCG/ACT nasal spray Place into the nose.      valACYclovir  (VALTREX) 500 MG tablet Take by mouth.        No Known Allergies   Past Medical History:  Diagnosis Date   Arthritis    Basal cell carcinoma of skin    skin   Elevated cholesterol    Genital herpes    Personal history of colonic polyps    Senile osteoporosis    Vitamin D deficiency     Review of systems:  Otherwise negative.    Physical Exam  Gen: Alert, oriented. Appears stated age.  HEENT: PERRLA. Lungs: No respiratory distress CV: RRR Abd: soft, benign, no masses Ext: No edema    Planned procedures: Proceed with colonoscopy. The patient understands the nature of the planned procedure, indications, risks, alternatives and potential complications including but not limited to bleeding, infection, perforation, damage to internal organs and possible oversedation/side effects from anesthesia. The patient agrees and gives consent to proceed.  Please refer to procedure notes for findings, recommendations and patient disposition/instructions.     Lesly Rubenstein, MD [facility] Gastroenterology

## 2020-11-25 NOTE — Op Note (Signed)
[facility] Gastroenterology Patient Name: Tracy Andrews Procedure Date: 11/25/2020 10:19 AM MRN: [id number] Account #: [id number] Date of Birth: [birth year] Admit Type: Outpatient Age: 75 Room: [facility] ENDO ROOM 3 Gender: Female Note Status: Finalized Instrument Name: Peds Colonoscope [id number] Procedure:             Colonoscopy Indications:           Surveillance: Personal history of adenomatous polyps                         on last colonoscopy > 5 years ago Providers:             Andrey Farmer MD, MD Medicines:             Monitored Anesthesia Care Complications:         No immediate complications. Procedure:             Pre-Anesthesia Assessment:                        - Prior to the procedure, a History and Physical was                         performed, and patient medications and allergies were                         reviewed. The patient is competent. The risks and                         benefits of the procedure and the sedation options and                         risks were discussed with the patient. All questions                         were answered and informed consent was obtained.                         Patient identification and proposed procedure were                         verified by the physician, the nurse, the anesthetist                         and the technician in the endoscopy suite. Mental                         Status Examination: alert and oriented. Airway                         Examination: normal oropharyngeal airway and neck                         mobility. Respiratory Examination: clear to                         auscultation. CV Examination: normal. Prophylactic                         Antibiotics: The patient does not require prophylactic  antibiotics. Prior Anticoagulants: The patient has                         taken no previous anticoagulant or antiplatelet                         agents. ASA  Grade Assessment: II - A patient with mild                         systemic disease. After reviewing the risks and                         benefits, the patient was deemed in satisfactory                         condition to undergo the procedure. The anesthesia                         plan was to use monitored anesthesia care (MAC).                         Immediately prior to administration of medications,                         the patient was re-assessed for adequacy to receive                         sedatives. The heart rate, respiratory rate, oxygen                         saturations, blood pressure, adequacy of pulmonary                         ventilation, and response to care were monitored                         throughout the procedure. The physical status of the                         patient was re-assessed after the procedure.                        After obtaining informed consent, the colonoscope was                         passed under direct vision. Throughout the procedure,                         the patient's blood pressure, pulse, and oxygen                         saturations were monitored continuously. The                         Colonoscope was introduced through the anus and                         advanced to the the cecum, identified by appendiceal  orifice and ileocecal valve. The colonoscopy was                         performed without difficulty. The patient tolerated                         the procedure well. The quality of the bowel                         preparation was good. Findings:      The perianal and digital rectal examinations were normal.      Multiple small-mouthed diverticula were found in the sigmoid colon,       descending colon, ascending colon and cecum.      Internal hemorrhoids were found during retroflexion. The hemorrhoids       were Grade I (internal hemorrhoids that do not prolapse).      The exam was  otherwise without abnormality on direct and retroflexion       views. Impression:            - Diverticulosis in the sigmoid colon, in the                         descending colon, in the ascending colon and in the                         cecum.                        - Internal hemorrhoids.                        - The examination was otherwise normal on direct and                         retroflexion views.                        - No specimens collected. Recommendation:        - Discharge patient to home.                        - Resume previous diet.                        - Continue present medications.                        - Repeat colonoscopy is not recommended due to current                         age (76 years or older) for surveillance.                        - Return to referring physician as previously                         scheduled. Procedure Code(s):     --- Professional ---                        P5374, Colorectal cancer screening; colonoscopy on  individual at high risk Diagnosis Code(s):     --- Professional ---                        Z86.010, Personal history of colonic polyps                        K64.0, First degree hemorrhoids                        K57.30, Diverticulosis of large intestine without                         perforation or abscess without bleeding CPT copyright 2019 American Medical Association. All rights reserved. The codes documented in this report are preliminary and upon coder review may  be revised to meet current compliance requirements. Andrey Farmer MD, MD 11/25/2020 10:50:24 AM Number of Addenda: 0 Note Initiated On: 11/25/2020 10:19 AM Scope Withdrawal Time: 0 hours 8 minutes 29 seconds  Total Procedure Duration: 0 hours 11 minutes 28 seconds  Estimated Blood Loss:  Estimated blood loss: none.      [facility]

## 2020-11-25 NOTE — Interval H&P Note (Signed)
History and Physical Interval Note:  11/25/2020 10:26 AM  Tracy Andrews  has presented today for surgery, with the diagnosis of HX ADEN POLYP.  The various methods of treatment have been discussed with the patient and family. After consideration of risks, benefits and other options for treatment, the patient has consented to  Procedure(s): COLONOSCOPY WITH PROPOFOL (N/A) as a surgical intervention.  The patient's history has been reviewed, patient examined, no change in status, stable for surgery.  I have reviewed the patient's chart and labs.  Questions were answered to the patient's satisfaction.     Lesly Rubenstein  Ok to proceed with colonoscopy

## 2020-11-25 NOTE — Anesthesia Preprocedure Evaluation (Signed)
Anesthesia Evaluation  Patient identified by MRN, date of birth, ID band Patient awake    Reviewed: Allergy & Precautions, NPO status , Patient's Chart, lab work & pertinent test results  History of Anesthesia Complications Negative for: history of anesthetic complications  Airway Mallampati: III  TM Distance: <3 FB Neck ROM: limited    Dental  (+) Chipped   Pulmonary neg pulmonary ROS, neg shortness of breath,    Pulmonary exam normal        Cardiovascular Exercise Tolerance: Good hypertension, (-) angina(-) Past MI Normal cardiovascular exam     Neuro/Psych negative neurological ROS  negative psych ROS   GI/Hepatic negative GI ROS, Neg liver ROS,   Endo/Other  negative endocrine ROS  Renal/GU negative Renal ROS  negative genitourinary   Musculoskeletal  (+) Arthritis ,   Abdominal   Peds  Hematology negative hematology ROS (+)   Anesthesia Other Findings Past Medical History: No date: Arthritis No date: Basal cell carcinoma of skin     Comment:  skin No date: Elevated cholesterol No date: Genital herpes No date: Personal history of colonic polyps No date: Senile osteoporosis No date: Vitamin D deficiency  Past Surgical History: No date: BREAST CYST ASPIRATION No date: CESAREAN SECTION     Comment:  x 2 No date: COLONOSCOPY No date: FRACTURE SURGERY 12/03/2014: HYSTEROSCOPY WITH D & C; N/A     Comment:  Procedure: DILATATION AND CURETTAGE /HYSTEROSCOPY;                Surgeon: Lorette Ang, MD;  Location: [facility] ORS;                Service: Gynecology;  Laterality: N/A; No date: WRIST SURGERY; Left     Comment:  put a pin in it  BMI    Body Mass Index: 22.49 kg/m      Reproductive/Obstetrics negative OB ROS                             Anesthesia Physical Anesthesia Plan  ASA: 2  Anesthesia Plan: General   Post-op Pain Management:    Induction:  Intravenous  PONV Risk Score and Plan: Propofol infusion and TIVA  Airway Management Planned: Natural Airway and Nasal Cannula  Additional Equipment:   Intra-op Plan:   Post-operative Plan:   Informed Consent: I have reviewed the patients History and Physical, chart, labs and discussed the procedure including the risks, benefits and alternatives for the proposed anesthesia with the patient or authorized representative who has indicated his/her understanding and acceptance.     Dental Advisory Given  Plan Discussed with: Anesthesiologist, CRNA and Surgeon  Anesthesia Plan Comments: (Patient consented for risks of anesthesia including but not limited to:  - adverse reactions to medications - risk of airway placement if required - damage to eyes, teeth, lips or other oral mucosa - nerve damage due to positioning  - sore throat or hoarseness - Damage to heart, brain, nerves, lungs, other parts of body or loss of life  Patient voiced understanding.)        Anesthesia Quick Evaluation

## 2020-11-25 NOTE — Transfer of Care (Signed)
Immediate Anesthesia Transfer of Care Note  Patient: Tracy Andrews  Procedure(s) Performed: COLONOSCOPY WITH PROPOFOL  Patient Location: Endoscopy Unit  Anesthesia Type:General  Level of Consciousness: drowsy  Airway & Oxygen Therapy: Patient Spontanous Breathing  Post-op Assessment: Report given to RN and Post -op Vital signs reviewed and stable  Post vital signs: Reviewed and stable  Last Vitals:  Vitals Value Taken Time  BP 119/58 11/25/20 1048  Temp    Pulse 60 11/25/20 1048  Resp 15 11/25/20 1048  SpO2 100 % 11/25/20 1048  Vitals shown include unvalidated device data.  Last Pain:  Vitals:   11/25/20 0926  TempSrc: Temporal  PainSc: 0-No pain         Complications: No notable events documented.

## 2020-11-29 NOTE — Anesthesia Postprocedure Evaluation (Signed)
Anesthesia Post Note  Patient: Tracy Andrews  Procedure(s) Performed: COLONOSCOPY WITH PROPOFOL  Patient location during evaluation: Endoscopy Anesthesia Type: General Level of consciousness: awake and alert Pain management: pain level controlled Vital Signs Assessment: post-procedure vital signs reviewed and stable Respiratory status: spontaneous breathing, nonlabored ventilation, respiratory function stable and patient connected to nasal cannula oxygen Cardiovascular status: blood pressure returned to baseline and stable Postop Assessment: no apparent nausea or vomiting Anesthetic complications: no   No notable events documented.   Last Vitals:  Vitals:   11/25/20 0926 11/25/20 1047  BP: (!) 156/79 (!) 119/58  Pulse: 69 62  Resp: 16 15  Temp: (!) 36.1 C 36.4 C  SpO2: 99% 100%    Last Pain:  Vitals:   11/26/20 1052  TempSrc:   PainSc: 0-No pain                 Precious Haws Kapena Hamme

## 2020-12-05 DIAGNOSIS — Z86010 Personal history of colonic polyps: Secondary | ICD-10-CM | POA: Diagnosis not present

## 2020-12-05 DIAGNOSIS — R42 Dizziness and giddiness: Secondary | ICD-10-CM | POA: Diagnosis not present

## 2020-12-05 DIAGNOSIS — E042 Nontoxic multinodular goiter: Secondary | ICD-10-CM | POA: Diagnosis not present

## 2020-12-05 DIAGNOSIS — R8290 Unspecified abnormal findings in urine: Secondary | ICD-10-CM | POA: Diagnosis not present

## 2020-12-05 DIAGNOSIS — E7800 Pure hypercholesterolemia, unspecified: Secondary | ICD-10-CM | POA: Diagnosis not present

## 2020-12-05 DIAGNOSIS — I10 Essential (primary) hypertension: Secondary | ICD-10-CM | POA: Diagnosis not present

## 2020-12-05 DIAGNOSIS — R3129 Other microscopic hematuria: Secondary | ICD-10-CM | POA: Diagnosis not present

## 2020-12-05 DIAGNOSIS — R7309 Other abnormal glucose: Secondary | ICD-10-CM | POA: Diagnosis not present

## 2020-12-12 DIAGNOSIS — Z0000 Encounter for general adult medical examination without abnormal findings: Secondary | ICD-10-CM | POA: Diagnosis not present

## 2020-12-12 DIAGNOSIS — Z23 Encounter for immunization: Secondary | ICD-10-CM | POA: Diagnosis not present

## 2020-12-12 DIAGNOSIS — N390 Urinary tract infection, site not specified: Secondary | ICD-10-CM | POA: Diagnosis not present

## 2020-12-12 DIAGNOSIS — B9620 Unspecified Escherichia coli [E. coli] as the cause of diseases classified elsewhere: Secondary | ICD-10-CM | POA: Diagnosis not present

## 2020-12-12 DIAGNOSIS — E7800 Pure hypercholesterolemia, unspecified: Secondary | ICD-10-CM | POA: Diagnosis not present

## 2020-12-12 DIAGNOSIS — R7309 Other abnormal glucose: Secondary | ICD-10-CM | POA: Diagnosis not present

## 2020-12-12 DIAGNOSIS — I10 Essential (primary) hypertension: Secondary | ICD-10-CM | POA: Diagnosis not present

## 2020-12-27 ENCOUNTER — Ambulatory Visit: Payer: PPO | Admitting: Dermatology

## 2020-12-27 ENCOUNTER — Other Ambulatory Visit: Payer: Self-pay

## 2020-12-27 DIAGNOSIS — L578 Other skin changes due to chronic exposure to nonionizing radiation: Secondary | ICD-10-CM

## 2020-12-27 DIAGNOSIS — L820 Inflamed seborrheic keratosis: Secondary | ICD-10-CM | POA: Diagnosis not present

## 2020-12-27 DIAGNOSIS — Z85828 Personal history of other malignant neoplasm of skin: Secondary | ICD-10-CM | POA: Diagnosis not present

## 2020-12-27 DIAGNOSIS — L821 Other seborrheic keratosis: Secondary | ICD-10-CM | POA: Diagnosis not present

## 2020-12-27 NOTE — Patient Instructions (Addendum)

## 2020-12-27 NOTE — Progress Notes (Signed)
   New Patient Visit  Subjective  Tracy Andrews is a 75 y.o. female who presents for the following: Skin Problem (Patient here today for a spot like a BB that was at left temple. Patient advises it was rough feeling but has since gone away. She does have a hx of BCC that was treated by another dr > 10 years ago. Also with a spot at left neck that we treated and has since started to come back. Patient is unsure how we treated but there is no pathology for the left neck. ).    The following portions of the chart were reviewed this encounter and updated as appropriate:       Review of Systems:  No other skin or systemic complaints except as noted in HPI or Assessment and Plan.  Objective  Well appearing patient in no apparent distress; mood and affect are within normal limits.  A focused examination was performed including face, neck, chest. Relevant physical exam findings are noted in the Assessment and Plan.  left lower neck Erythematous keratotic or waxy stuck-on papule  L temple is clear   Assessment & Plan  Inflamed seborrheic keratosis left lower neck  Destruction of lesion - left lower neck  Destruction method: cryotherapy   Informed consent: discussed and consent obtained   Lesion destroyed using liquid nitrogen: Yes   Region frozen until ice ball extended beyond lesion: Yes   Outcome: patient tolerated procedure well with no complications   Post-procedure details: wound care instructions given   Additional details:  Prior to procedure, discussed risks of blister formation, small wound, skin dyspigmentation, or rare scar following cryotherapy. Recommend Vaseline ointment to treated areas while healing.   Seborrheic Keratoses - Stuck-on, waxy, tan-brown papules - Benign-appearing - Discussed benign etiology and prognosis. - Observe - Call for any changes  Actinic Damage - chronic, secondary to cumulative UV radiation exposure/sun exposure over time - diffuse  scaly erythematous macules with underlying dyspigmentation - Recommend daily broad spectrum sunscreen SPF 30+ to sun-exposed areas, reapply every 2 hours as needed.  - Recommend staying in the shade or wearing long sleeves, sun glasses (UVA+UVB protection) and wide brim hats (4-inch brim around the entire circumference of the hat). - Call for new or changing lesions.  History of Basal Cell Carcinoma of the Skin - No evidence of recurrence today at left superior nasolabial  - Recommend regular full body skin exams - Recommend daily broad spectrum sunscreen SPF 30+ to sun-exposed areas, reapply every 2 hours as needed.  - Call if any new or changing lesions are noted between office visits  Return if symptoms worsen or fail to improve.  Graciella Belton, RMA, am acting as scribe for Brendolyn Patty, MD .  Documentation: I have reviewed the above documentation for accuracy and completeness, and I agree with the above.  Brendolyn Patty MD

## 2021-04-21 DIAGNOSIS — E7800 Pure hypercholesterolemia, unspecified: Secondary | ICD-10-CM | POA: Diagnosis not present

## 2021-04-21 DIAGNOSIS — I10 Essential (primary) hypertension: Secondary | ICD-10-CM | POA: Diagnosis not present

## 2021-04-21 DIAGNOSIS — R053 Chronic cough: Secondary | ICD-10-CM | POA: Diagnosis not present

## 2021-04-21 DIAGNOSIS — R300 Dysuria: Secondary | ICD-10-CM | POA: Diagnosis not present

## 2021-05-10 DIAGNOSIS — H2513 Age-related nuclear cataract, bilateral: Secondary | ICD-10-CM | POA: Diagnosis not present

## 2021-05-10 DIAGNOSIS — D3131 Benign neoplasm of right choroid: Secondary | ICD-10-CM | POA: Diagnosis not present

## 2021-06-05 DIAGNOSIS — R053 Chronic cough: Secondary | ICD-10-CM | POA: Diagnosis not present

## 2021-06-05 DIAGNOSIS — R7309 Other abnormal glucose: Secondary | ICD-10-CM | POA: Diagnosis not present

## 2021-06-05 DIAGNOSIS — R300 Dysuria: Secondary | ICD-10-CM | POA: Diagnosis not present

## 2021-06-05 DIAGNOSIS — I10 Essential (primary) hypertension: Secondary | ICD-10-CM | POA: Diagnosis not present

## 2021-06-05 DIAGNOSIS — N390 Urinary tract infection, site not specified: Secondary | ICD-10-CM | POA: Diagnosis not present

## 2021-06-05 DIAGNOSIS — Z0000 Encounter for general adult medical examination without abnormal findings: Secondary | ICD-10-CM | POA: Diagnosis not present

## 2021-06-05 DIAGNOSIS — E7800 Pure hypercholesterolemia, unspecified: Secondary | ICD-10-CM | POA: Diagnosis not present

## 2021-06-05 DIAGNOSIS — B9620 Unspecified Escherichia coli [E. coli] as the cause of diseases classified elsewhere: Secondary | ICD-10-CM | POA: Diagnosis not present

## 2021-06-12 ENCOUNTER — Other Ambulatory Visit: Payer: Self-pay | Admitting: Internal Medicine

## 2021-06-12 DIAGNOSIS — E7800 Pure hypercholesterolemia, unspecified: Secondary | ICD-10-CM | POA: Diagnosis not present

## 2021-06-12 DIAGNOSIS — R7309 Other abnormal glucose: Secondary | ICD-10-CM | POA: Diagnosis not present

## 2021-06-12 DIAGNOSIS — R311 Benign essential microscopic hematuria: Secondary | ICD-10-CM | POA: Diagnosis not present

## 2021-06-12 DIAGNOSIS — Z0000 Encounter for general adult medical examination without abnormal findings: Secondary | ICD-10-CM | POA: Diagnosis not present

## 2021-06-12 DIAGNOSIS — I10 Essential (primary) hypertension: Secondary | ICD-10-CM | POA: Diagnosis not present

## 2021-06-12 DIAGNOSIS — Z1231 Encounter for screening mammogram for malignant neoplasm of breast: Secondary | ICD-10-CM

## 2021-07-18 ENCOUNTER — Ambulatory Visit
Admission: RE | Admit: 2021-07-18 | Discharge: 2021-07-18 | Disposition: A | Discharge status: Home / Self Care | Payer: PPO | Source: Ambulatory Visit | Attending: Internal Medicine | Admitting: Internal Medicine

## 2021-07-18 DIAGNOSIS — Z1231 Encounter for screening mammogram for malignant neoplasm of breast: Secondary | ICD-10-CM | POA: Insufficient documentation

## 2021-07-28 ENCOUNTER — Other Ambulatory Visit: Payer: Self-pay | Admitting: Internal Medicine

## 2021-07-28 DIAGNOSIS — N63 Unspecified lump in unspecified breast: Secondary | ICD-10-CM

## 2021-07-28 DIAGNOSIS — R928 Other abnormal and inconclusive findings on diagnostic imaging of breast: Secondary | ICD-10-CM

## 2021-08-11 ENCOUNTER — Ambulatory Visit
Admission: RE | Admit: 2021-08-11 | Discharge: 2021-08-11 | Disposition: A | Discharge status: Home / Self Care | Payer: PPO | Source: Ambulatory Visit | Attending: Internal Medicine | Admitting: Internal Medicine

## 2021-08-11 DIAGNOSIS — N63 Unspecified lump in unspecified breast: Secondary | ICD-10-CM

## 2021-08-11 DIAGNOSIS — R928 Other abnormal and inconclusive findings on diagnostic imaging of breast: Secondary | ICD-10-CM | POA: Insufficient documentation

## 2021-10-02 DIAGNOSIS — E042 Nontoxic multinodular goiter: Secondary | ICD-10-CM | POA: Diagnosis not present

## 2021-10-02 DIAGNOSIS — E7800 Pure hypercholesterolemia, unspecified: Secondary | ICD-10-CM | POA: Diagnosis not present

## 2021-10-02 DIAGNOSIS — S99922A Unspecified injury of left foot, initial encounter: Secondary | ICD-10-CM | POA: Diagnosis not present

## 2021-10-02 DIAGNOSIS — I10 Essential (primary) hypertension: Secondary | ICD-10-CM | POA: Diagnosis not present

## 2021-12-06 DIAGNOSIS — Z0000 Encounter for general adult medical examination without abnormal findings: Secondary | ICD-10-CM | POA: Diagnosis not present

## 2021-12-06 DIAGNOSIS — E7800 Pure hypercholesterolemia, unspecified: Secondary | ICD-10-CM | POA: Diagnosis not present

## 2021-12-06 DIAGNOSIS — I10 Essential (primary) hypertension: Secondary | ICD-10-CM | POA: Diagnosis not present

## 2021-12-06 DIAGNOSIS — R311 Benign essential microscopic hematuria: Secondary | ICD-10-CM | POA: Diagnosis not present

## 2021-12-06 DIAGNOSIS — R7309 Other abnormal glucose: Secondary | ICD-10-CM | POA: Diagnosis not present

## 2021-12-14 DIAGNOSIS — M769 Unspecified enthesopathy, lower limb, excluding foot: Secondary | ICD-10-CM | POA: Diagnosis not present

## 2021-12-14 DIAGNOSIS — R7309 Other abnormal glucose: Secondary | ICD-10-CM | POA: Diagnosis not present

## 2021-12-14 DIAGNOSIS — M79671 Pain in right foot: Secondary | ICD-10-CM | POA: Diagnosis not present

## 2021-12-14 DIAGNOSIS — Z23 Encounter for immunization: Secondary | ICD-10-CM | POA: Diagnosis not present

## 2021-12-14 DIAGNOSIS — E7800 Pure hypercholesterolemia, unspecified: Secondary | ICD-10-CM | POA: Diagnosis not present

## 2021-12-14 DIAGNOSIS — I10 Essential (primary) hypertension: Secondary | ICD-10-CM | POA: Diagnosis not present

## 2022-01-09 ENCOUNTER — Encounter: Payer: PPO | Admitting: Dermatology

## 2022-01-16 ENCOUNTER — Ambulatory Visit: Payer: PPO | Admitting: Dermatology

## 2022-01-16 DIAGNOSIS — L814 Other melanin hyperpigmentation: Secondary | ICD-10-CM

## 2022-01-16 DIAGNOSIS — M67431 Ganglion, right wrist: Secondary | ICD-10-CM | POA: Diagnosis not present

## 2022-01-16 DIAGNOSIS — D2262 Melanocytic nevi of left upper limb, including shoulder: Secondary | ICD-10-CM

## 2022-01-16 DIAGNOSIS — L821 Other seborrheic keratosis: Secondary | ICD-10-CM

## 2022-01-16 DIAGNOSIS — Z85828 Personal history of other malignant neoplasm of skin: Secondary | ICD-10-CM | POA: Diagnosis not present

## 2022-01-16 DIAGNOSIS — M6740 Ganglion, unspecified site: Secondary | ICD-10-CM

## 2022-01-16 DIAGNOSIS — L578 Other skin changes due to chronic exposure to nonionizing radiation: Secondary | ICD-10-CM

## 2022-01-16 DIAGNOSIS — Z1283 Encounter for screening for malignant neoplasm of skin: Secondary | ICD-10-CM | POA: Diagnosis not present

## 2022-01-16 DIAGNOSIS — L820 Inflamed seborrheic keratosis: Secondary | ICD-10-CM

## 2022-01-16 DIAGNOSIS — L719 Rosacea, unspecified: Secondary | ICD-10-CM

## 2022-01-16 MED ORDER — METRONIDAZOLE 0.75 % EX CREA: TOPICAL_CREAM | CUTANEOUS | 11 refills | Status: DC

## 2022-01-16 NOTE — Patient Instructions (Addendum)
Rosacea  What is rosacea? Rosacea (say: ro-zay-sha) is a common skin disease that usually begins as a trend of flushing or blushing easily.  As rosacea progresses, a persistent redness in the center of the face will develop and may gradually spread beyond the nose and cheeks to the forehead and chin.  In some cases, the ears, chest, and back could be affected.  Rosacea may appear as tiny blood vessels or small red bumps that occur in crops.  Frequently they can contain pus, and are called "pustules".  If the bumps do not contain pus, they are referred to as "papules".  Rarely, in prolonged, untreated cases of rosacea, the oil glands of the nose and cheeks may become permanently enlarged.  This is called rhinophyma, and is seen more frequently in men.  Signs and Risks In its beginning stages, rosacea tends to come and go, which makes it difficult to recognize.  It can start as intermittent flushing of the face.  Eventually, blood vessels may become permanently visible.  Pustules and papules can appear, but can be mistaken for adult acne.  People of all races, ages, genders and ethnic groups are at risk of developing rosacea.  However, it is more common in women (especially around menopause) and adults with fair skin between the ages of 3 and 87.  Treatment Dermatologists typically recommend a combination of treatments to effectively manage rosacea.  Treatment can improve symptoms and may stop the progression of the rosacea.  Treatment may involve both topical and oral medications.  The tetracycline antibiotics are often used for their anti-inflammatory effect; however, because of the possibility of developing antibiotic resistance, they should not be used long term at full dose.  For dilated blood vessels the options include electrodessication (uses electric current through a small needle), laser treatment, and cosmetics to hide the redness.   With all forms of treatment, improvement is a slow process, and  patients may not see any results for the first 3-4 weeks.  It is very important to avoid the sun and other triggers.  Patients must wear sunscreen daily.  Skin Care Instructions: Cleanse the skin with a mild soap such as CeraVe cleanser, Cetaphil cleanser, or Dove soap once or twice daily as needed. Moisturize with Eucerin Redness Relief Daily Perfecting Lotion (has a subtle green tint), CeraVe Moisturizing Cream, or Oil of Olay Daily Moisturizer with sunscreen every morning and/or night as recommended. Makeup should be "non-comedogenic" (won't clog pores) and be labeled "for sensitive skin". Good choices for cosmetics are: Neutrogena, Almay, and Physician's Formula.  Any product with a green tint tends to offset a red complexion. If your eyes are dry and irritated, use artificial tears 2-3 times per day and cleanse the eyelids daily with baby shampoo.  Have your eyes examined at least every 2 years.  Be sure to tell your eye doctor that you have rosacea. Alcoholic beverages tend to cause flushing of the skin, and may make rosacea worse. Always wear sunscreen, protect your skin from extreme hot and cold temperatures, and avoid spicy foods, hot drinks, and mechanical irritation such as rubbing, scrubbing, or massaging the face.  Avoid harsh skin cleansers, cleansing masks, astringents, and exfoliation. If a particular product burns or makes your face feel tight, then it is likely to flare your rosacea. If you are having difficulty finding a sunscreen that you can tolerate, you may try switching to a chemical-free sunscreen.  These are ones whose active ingredient is zinc oxide or titanium dioxide  only.  They should also be fragrance free, non-comedogenic, and labeled for sensitive skin. Rosacea triggers may vary from person to person.  There are a variety of foods that have been reported to trigger rosacea.  Some patients find that keeping a diary of what they were doing when they flared helps them avoid  triggers.   Start metronidazole cream apply daily to twice daily to cheeks and nose.   Seborrheic Keratosis  What causes seborrheic keratoses? Seborrheic keratoses are harmless, common skin growths that first appear during adult life.  As time goes by, more growths appear.  Some people may develop a large number of them.  Seborrheic keratoses appear on both covered and uncovered body parts.  They are not caused by sunlight.  The tendency to develop seborrheic keratoses can be inherited.  They vary in color from skin-colored to gray, brown, or even black.  They can be either smooth or have a rough, warty surface.   Seborrheic keratoses are superficial and look as if they were stuck on the skin.  Under the microscope this type of keratosis looks like layers upon layers of skin.  That is why at times the top layer may seem to fall off, but the rest of the growth remains and re-grows.    Treatment Seborrheic keratoses do not need to be treated, but can easily be removed in the office.  Seborrheic keratoses often cause symptoms when they rub on clothing or jewelry.  Lesions can be in the way of shaving.  If they become inflamed, they can cause itching, soreness, or burning.  Removal of a seborrheic keratosis can be accomplished by freezing, burning, or surgery. If any spot bleeds, scabs, or grows rapidly, please return to have it checked, as these can be an indication of a skin cancer.  Cryotherapy Aftercare  Wash gently with soap and water everyday.   Apply Vaseline and Band-Aid daily until healed.       Melanoma ABCDEs  Melanoma is the most dangerous type of skin cancer, and is the leading cause of death from skin disease.  You are more likely to develop melanoma if you: Have light-colored skin, light-colored eyes, or red or blond hair Spend a lot of time in the sun Tan regularly, either outdoors or in a tanning bed Have had blistering sunburns, especially during childhood Have a close  family member who has had a melanoma Have atypical moles or large birthmarks  Early detection of melanoma is key since treatment is typically straightforward and cure rates are extremely high if we catch it early.   The first sign of melanoma is often a change in a mole or a new dark spot.  The ABCDE system is a way of remembering the signs of melanoma.  A for asymmetry:  The two halves do not match. B for border:  The edges of the growth are irregular. C for color:  A mixture of colors are present instead of an even brown color. D for diameter:  Melanomas are usually (but not always) greater than 14m - the size of a pencil eraser. E for evolution:  The spot keeps changing in size, shape, and color.  Please check your skin once per month between visits. You can use a small mirror in front and a large mirror behind you to keep an eye on the back side or your body.   If you see any new or changing lesions before your next follow-up, please call to schedule a visit.  Please continue daily skin protection including broad spectrum sunscreen SPF 30+ to sun-exposed areas, reapplying every 2 hours as needed when you're outdoors.   Staying in the shade or wearing long sleeves, sun glasses (UVA+UVB protection) and wide brim hats (4-inch brim around the entire circumference of the hat) are also recommended for sun protection.    Due to recent changes in healthcare laws, you may see results of your pathology and/or laboratory studies on MyChart before the doctors have had a chance to review them. We understand that in some cases there may be results that are confusing or concerning to you. Please understand that not all results are received at the same time and often the doctors may need to interpret multiple results in order to provide you with the best plan of care or course of treatment. Therefore, we ask that you please give [place] 2 business days to thoroughly review all your results before contacting the  office for clarification. Should we see a critical lab result, you will be contacted sooner.   If You Need Anything After Your Visit  If you have any questions or concerns for your doctor, please call our main line at [phone] and press option 4 to reach your doctor's medical assistant. If no one answers, please leave a voicemail as directed and we will return your call as soon as possible. Messages left after 4 pm will be answered the following business day.   You may also send [place] a message via [place]. We typically respond to MyChart messages within 1-2 business days.  For prescription refills, please ask your pharmacy to contact our office. Our fax number is [phone].  If you have an urgent issue when the clinic is closed that cannot wait until the next business day, you can page your doctor at the number below.    Please note that while we do our best to be available for urgent issues outside of office hours, we are not available 24/7.   If you have an urgent issue and are unable to reach [place], you may choose to seek medical care at your doctor's office, retail clinic, urgent care center, or emergency room.  If you have a medical emergency, please immediately call 911 or go to the emergency department.  Pager Numbers  - Dr. Nehemiah Massed: [phone]  - Dr. Laurence Ferrari: [phone]  - Dr. Nicole Kindred: [phone]  In the event of inclement weather, please call our main line at [phone] for an update on the status of any delays or closures.  Dermatology Medication Tips: Please keep the boxes that topical medications come in in order to help keep track of the instructions about where and how to use these. Pharmacies typically print the medication instructions only on the boxes and not directly on the medication tubes.   If your medication is too expensive, please contact our office at [phone] option 4 or send [place] a message through [place].   We are unable to tell what your co-pay  for medications will be in advance as this is different depending on your insurance coverage. However, we may be able to find a substitute medication at lower cost or fill out paperwork to get insurance to cover a needed medication.   If a prior authorization is required to get your medication covered by your insurance company, please allow [place] 1-2 business days to complete this process.  Drug prices often vary depending on where the prescription is filled and some pharmacies may offer cheaper prices.  The website www.goodrx.com contains coupons for medications through different pharmacies. The prices here do not account for what the cost may be with help from insurance (it may be cheaper with your insurance), but the website can give you the price if you did not use any insurance.  - You can print the associated coupon and take it with your prescription to the pharmacy.  - You may also stop by our office during regular business hours and pick up a GoodRx coupon card.  - If you need your prescription sent electronically to a different pharmacy, notify our office through [facility] or by phone at [phone] option 4.     Si Usted Necesita Algo Despus de Su Visita  Tambin puede enviarnos un mensaje a travs de Pharmacist, community. Por lo general respondemos a los mensajes de MyChart en el transcurso de 1 a 2 das hbiles.  Para renovar recetas, por favor pida a su farmacia que se ponga en contacto con nuestra oficina. Harland Dingwall de fax es [place] [phone].  Si tiene un asunto urgente cuando la clnica est cerrada y que no puede esperar hasta el siguiente da hbil, puede llamar/localizar a su doctor(a) al nmero que aparece a continuacin.   Por favor, tenga en cuenta que aunque hacemos todo lo posible para estar disponibles para asuntos urgentes fuera del horario de [place], no estamos disponibles las 24 horas del da, los 7 das de la [place].   Si tiene un problema urgente y no puede  comunicarse con nosotros, puede optar por buscar atencin mdica  en el consultorio de su doctor(a), en una clnica privada, en un centro de atencin urgente o en una sala de emergencias.  Si tiene Engineering geologist, por favor llame inmediatamente al 911 o vaya a la sala de emergencias.  Nmeros de bper  - Dr. Nehemiah Massed: [phone]  - Dra. Moye: [phone]  - Dra. Nicole Kindred: [phone]  En caso de inclemencias del [place], por favor llame a Johnsie Kindred principal al [phone] para una actualizacin sobre el [place] de cualquier retraso o cierre.  Consejos para la medicacin en dermatologa: Por favor, guarde las cajas en las que vienen los medicamentos de uso tpico para ayudarle a seguir las instrucciones sobre dnde y cmo usarlos. Las farmacias generalmente imprimen las instrucciones del medicamento slo en las cajas y no directamente en los tubos del [place].   Si su medicamento es muy caro, por favor, pngase en contacto con Zigmund Daniel llamando al [phone] y presione la opcin 4 o envenos un mensaje a travs de Pharmacist, community.   No podemos decirle cul ser su copago por los medicamentos por adelantado ya que esto es diferente dependiendo de la cobertura de su seguro. Sin embargo, es posible que podamos encontrar un medicamento sustituto a Electrical engineer un formulario para que el seguro cubra el medicamento que se considera necesario.   Si se requiere una autorizacin previa para que su compaa de seguros [place] su medicamento, por favor permtanos de 1 a 2 das hbiles para completar este proceso.  Los precios de los medicamentos varan con frecuencia dependiendo del Environmental consultant de dnde se surte la receta y alguna farmacias pueden ofrecer precios ms baratos.  El sitio web www.goodrx.com tiene cupones para medicamentos de Airline pilot. Los precios aqu no tienen en cuenta lo que podra costar con la ayuda del seguro (puede ser ms barato con su seguro), pero  el sitio web puede darle el precio si no utiliz Research scientist (physical sciences).  -  imprimir el cupn correspondiente y llevarlo con su receta a la farmacia.  - Tambin puede pasar por nuestra oficina durante el horario de atencin regular y recoger una tarjeta de cupones de GoodRx.  - Si necesita que su receta se enve electrnicamente a una farmacia diferente, informe a nuestra oficina a travs de MyChart de [place] o por telfono llamando al [phone] y presione la opcin 4.  

## 2022-01-16 NOTE — Progress Notes (Signed)
Follow-Up Visit   Subjective  Tracy Andrews is a 76 y.o. female who presents for the following: Annual Exam (1 year tbse. ).  The patient presents for Total-Body Skin Exam (TBSE) for skin cancer screening and mole check.  The patient has spots, moles and lesions to be evaluated, some may be new or changing and the patient has concerns that these could be cancer. She has a spot on chest that gets irritated.   The following portions of the chart were reviewed this encounter and updated as appropriate:      Review of Systems: No other skin or systemic complaints except as noted in HPI or Assessment and Plan.   Objective  Well appearing patient in no apparent distress; mood and affect are within normal limits.  A full examination was performed including scalp, head, eyes, ears, nose, lips, neck, chest, axillae, abdomen, back, buttocks, bilateral upper extremities, bilateral lower extremities, hands, feet, fingers, toes, fingernails, and toenails. All findings within normal limits unless otherwise noted below.  nose and malar cheeks Erythema and bumps at nose and mid cheeks   upper sternum x 1 Erythematous stuck-on, waxy papule   right flexor wrist at palm ~0.6 cm firm subcutaneous nodules x 2    Assessment & Plan  Rosacea nose and malar cheeks  Chronic and persistent condition with duration or expected duration over one year. Condition is bothersome/symptomatic for patient. Currently flared.   Rosacea is a chronic progressive skin condition usually affecting the face of adults, causing redness and/or acne bumps. It is treatable but not curable. It sometimes affects the eyes (ocular rosacea) as well. It may respond to topical and/or systemic medication and can flare with stress, sun exposure, alcohol, exercise, topical steroids (including hydrocortisone/cortisone 10) and some foods.  Daily application of broad spectrum spf 30+ sunscreen to face is recommended to reduce  flares.  Start metro 0.75 % cream - apply qd/bid to nose and mid cheeks   metroNIDAZOLE (METROCREAM) 0.75 % cream - nose and malar cheeks Apply topically qd/bid to nose and mid cheeks for rosacea  Inflamed seborrheic keratosis upper sternum x 1  Symptomatic, irritating, patient would like treated.  Destruction of lesion - upper sternum x 1  Destruction method: cryotherapy   Informed consent: discussed and consent obtained   Lesion destroyed using liquid nitrogen: Yes   Region frozen until ice ball extended beyond lesion: Yes   Outcome: patient tolerated procedure well with no complications   Post-procedure details: wound care instructions given   Additional details:  Prior to procedure, discussed risks of blister formation, small wound, skin dyspigmentation, or rare scar following cryotherapy. Recommend Vaseline ointment to treated areas while healing.   Ganglion cyst right flexor wrist at palm  Benign-appearing. Observe.  If bothersome, recommend contacting Orthopedic hand surgeon for possible removal.     Lentigines - Scattered tan macules - Due to sun exposure - Benign-appearing, observe - Recommend daily broad spectrum sunscreen SPF 30+ to sun-exposed areas, reapply every 2 hours as needed. - Call for any changes  Seborrheic Keratoses - Stuck-on, waxy, tan-brown papules and/or plaques  - Benign-appearing - Discussed benign etiology and prognosis. - Observe - Call for any changes  Melanocytic Nevi Left upper arm  - Tan-brown and/or pink-flesh-colored symmetric macules and papules - Benign appearing on exam today - Observation - Call clinic for new or changing moles - Recommend daily use of broad spectrum spf 30+ sunscreen to sun-exposed areas.   Hemangiomas - Red papules -  Discussed benign nature - Observe - Call for any changes  Actinic Damage - Chronic condition, secondary to cumulative UV/sun exposure - diffuse scaly erythematous macules with  underlying dyspigmentation - Recommend daily broad spectrum sunscreen SPF 30+ to sun-exposed areas, reapply every 2 hours as needed.  - Staying in the shade or wearing long sleeves, sun glasses (UVA+UVB protection) and wide brim hats (4-inch brim around the entire circumference of the hat) are also recommended for sun protection.  - Call for new or changing lesions.  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  - No evidence of recurrence today - Recommend regular full body skin exams - Recommend daily broad spectrum sunscreen SPF 30+ to sun-exposed areas, reapply every 2 hours as needed.  - Call if any new or changing lesions are noted between office visits  Skin cancer screening performed today. Return in about 1 year (around 01/17/2023) for TBSE. I, Ruthell Rummage, CMA, am acting as scribe for Brendolyn Patty, MD.  Documentation: I have reviewed the above documentation for accuracy and completeness, and I agree with the above.  Brendolyn Patty MD

## 2022-10-01 ENCOUNTER — Other Ambulatory Visit: Payer: Self-pay | Admitting: Internal Medicine

## 2022-10-01 DIAGNOSIS — Z1231 Encounter for screening mammogram for malignant neoplasm of breast: Secondary | ICD-10-CM

## 2022-10-18 ENCOUNTER — Ambulatory Visit
Admission: RE | Admit: 2022-10-18 | Discharge: 2022-10-18 | Disposition: A | Discharge status: Home / Self Care | Payer: Medicare HMO | Source: Ambulatory Visit | Attending: Internal Medicine | Admitting: Internal Medicine

## 2022-10-18 DIAGNOSIS — Z1231 Encounter for screening mammogram for malignant neoplasm of breast: Secondary | ICD-10-CM | POA: Diagnosis present

## 2023-01-22 ENCOUNTER — Ambulatory Visit: Payer: Medicare HMO | Admitting: Dermatology

## 2023-01-22 DIAGNOSIS — L719 Rosacea, unspecified: Secondary | ICD-10-CM

## 2023-01-22 DIAGNOSIS — L570 Actinic keratosis: Secondary | ICD-10-CM

## 2023-01-22 DIAGNOSIS — D1801 Hemangioma of skin and subcutaneous tissue: Secondary | ICD-10-CM

## 2023-01-22 DIAGNOSIS — Z1283 Encounter for screening for malignant neoplasm of skin: Secondary | ICD-10-CM

## 2023-01-22 DIAGNOSIS — L729 Follicular cyst of the skin and subcutaneous tissue, unspecified: Secondary | ICD-10-CM

## 2023-01-22 DIAGNOSIS — Z85828 Personal history of other malignant neoplasm of skin: Secondary | ICD-10-CM

## 2023-01-22 DIAGNOSIS — L814 Other melanin hyperpigmentation: Secondary | ICD-10-CM

## 2023-01-22 DIAGNOSIS — L578 Other skin changes due to chronic exposure to nonionizing radiation: Secondary | ICD-10-CM

## 2023-01-22 DIAGNOSIS — W908XXA Exposure to other nonionizing radiation, initial encounter: Secondary | ICD-10-CM

## 2023-01-22 DIAGNOSIS — D229 Melanocytic nevi, unspecified: Secondary | ICD-10-CM

## 2023-01-22 DIAGNOSIS — L821 Other seborrheic keratosis: Secondary | ICD-10-CM

## 2023-01-22 MED ORDER — METRONIDAZOLE 0.75 % EX CREA: TOPICAL_CREAM | CUTANEOUS | 11 refills | Status: AC

## 2023-01-22 NOTE — Progress Notes (Unsigned)
Follow-Up Visit   Subjective  Tracy Andrews is a 77 y.o. female who presents for the following: Skin Cancer Screening and Full Body Skin Exam  The patient presents for Total-Body Skin Exam (TBSE) for skin cancer screening and mole check. The patient has spots, moles and lesions to be evaluated, some may be new or changing and the patient may have concern these could be cancer.  Patient with hx of BCC. Using metronidazole 0.75% cream once daily on most days.   The following portions of the chart were reviewed this encounter and updated as appropriate: medications, allergies, medical history  Review of Systems:  No other skin or systemic complaints except as noted in HPI or Assessment and Plan.  Objective  Well appearing patient in no apparent distress; mood and affect are within normal limits.  A full examination was performed including scalp, head, eyes, ears, nose, lips, neck, chest, axillae, abdomen, back, buttocks, bilateral upper extremities, bilateral lower extremities, hands, feet, fingers, toes, fingernails, and toenails. All findings within normal limits unless otherwise noted below.   Relevant physical exam findings are noted in the Assessment and Plan.  R malar cheek Erythematous thin papules/macules with gritty scale.     Assessment & Plan   SKIN CANCER SCREENING PERFORMED TODAY.  ACTINIC DAMAGE - Chronic condition, secondary to cumulative UV/sun exposure - diffuse scaly erythematous macules with underlying dyspigmentation - Recommend daily broad spectrum sunscreen SPF 30+ to sun-exposed areas, reapply every 2 hours as needed.  - Staying in the shade or wearing long sleeves, sun glasses (UVA+UVB protection) and wide brim hats (4-inch brim around the entire circumference of the hat) are also recommended for sun protection.  - Call for new or changing lesions.  LENTIGINES, SEBORRHEIC KERATOSES, HEMANGIOMAS - Benign normal skin lesions - Benign-appearing - Call  for any changes  MELANOCYTIC NEVI - Tan-brown and/or pink-flesh-colored symmetric macules and papules - Benign appearing on exam today - Observation - Call clinic for new or changing moles - Recommend daily use of broad spectrum spf 30+ sunscreen to sun-exposed areas.   HISTORY OF BASAL CELL CARCINOMA OF THE SKIN - No evidence of recurrence today - Recommend regular full body skin exams - Recommend daily broad spectrum sunscreen SPF 30+ to sun-exposed areas, reapply every 2 hours as needed.  - Call if any new or changing lesions are noted between office visits  ROSACEA Exam Erythema malar cheeks  wellcontrolled vs notatgoal vs flared***  Rosacea is a chronic progressive skin condition usually affecting the face of adults, causing redness and/or acne bumps. It is treatable but not curable. It sometimes affects the eyes (ocular rosacea) as well. It may respond to topical and/or systemic medication and can flare with stress, sun exposure, alcohol, exercise, topical steroids (including hydrocortisone/cortisone 10) and some foods.  Daily application of broad spectrum spf 30+ sunscreen to face is recommended to reduce flares.  Treatment Plan Continue metronidazole 0.75% cream 1-2 times daily.   Ganglion cyst right flexor wrist at palm  0.6 cm firm flesh subcutaneous nodule   Benign-appearing. Observe.   If bothersome, recommend contacting Orthopedic hand surgeon for possible removal.    AK (actinic keratosis) R malar cheek  Actinic keratoses are precancerous spots that appear secondary to cumulative UV radiation exposure/sun exposure over time. They are chronic with expected duration over 1 year. A portion of actinic keratoses will progress to squamous cell carcinoma of the skin. It is not possible to reliably predict which spots will progress to skin  cancer and so treatment is recommended to prevent development of skin cancer.  Recommend daily broad spectrum sunscreen SPF 30+ to  sun-exposed areas, reapply every 2 hours as needed.  Recommend staying in the shade or wearing long sleeves, sun glasses (UVA+UVB protection) and wide brim hats (4-inch brim around the entire circumference of the hat). Call for new or changing lesions.  Destruction of lesion - R malar cheek  Destruction method: cryotherapy   Informed consent: discussed and consent obtained   Lesion destroyed using liquid nitrogen: Yes   Region frozen until ice ball extended beyond lesion: Yes   Outcome: patient tolerated procedure well with no complications   Post-procedure details: wound care instructions given   Additional details:  Prior to procedure, discussed risks of blister formation, small wound, skin dyspigmentation, or rare scar following cryotherapy. Recommend Vaseline ointment to treated areas while healing.   Rosacea  Related Medications metroNIDAZOLE (METROCREAM) 0.75 % cream Apply topically qd/bid to nose and mid cheeks for rosacea   Return in about 1 year (around 01/22/2024) for TBSE, with Dr. Roseanne Reno, Hx BCC, Hx AK.  Anise Salvo, RMA, am acting as scribe for Willeen Niece, MD .   Documentation: I have reviewed the above documentation for accuracy and completeness, and I agree with the above.  Willeen Niece, MD

## 2023-01-22 NOTE — Patient Instructions (Signed)

## 2023-06-14 DIAGNOSIS — D3131 Benign neoplasm of right choroid: Secondary | ICD-10-CM | POA: Diagnosis not present

## 2023-06-14 DIAGNOSIS — H5203 Hypermetropia, bilateral: Secondary | ICD-10-CM | POA: Diagnosis not present

## 2023-06-14 DIAGNOSIS — H2513 Age-related nuclear cataract, bilateral: Secondary | ICD-10-CM | POA: Diagnosis not present

## 2023-06-18 DIAGNOSIS — I10 Essential (primary) hypertension: Secondary | ICD-10-CM | POA: Diagnosis not present

## 2023-06-18 DIAGNOSIS — E042 Nontoxic multinodular goiter: Secondary | ICD-10-CM | POA: Diagnosis not present

## 2023-06-18 DIAGNOSIS — E7800 Pure hypercholesterolemia, unspecified: Secondary | ICD-10-CM | POA: Diagnosis not present

## 2023-06-18 DIAGNOSIS — R7309 Other abnormal glucose: Secondary | ICD-10-CM | POA: Diagnosis not present

## 2023-06-25 DIAGNOSIS — E042 Nontoxic multinodular goiter: Secondary | ICD-10-CM | POA: Diagnosis not present

## 2023-06-25 DIAGNOSIS — Z0000 Encounter for general adult medical examination without abnormal findings: Secondary | ICD-10-CM | POA: Diagnosis not present

## 2023-06-25 DIAGNOSIS — M67431 Ganglion, right wrist: Secondary | ICD-10-CM | POA: Diagnosis not present

## 2023-06-25 DIAGNOSIS — I10 Essential (primary) hypertension: Secondary | ICD-10-CM | POA: Diagnosis not present

## 2023-06-25 DIAGNOSIS — R7309 Other abnormal glucose: Secondary | ICD-10-CM | POA: Diagnosis not present

## 2023-06-25 DIAGNOSIS — E7800 Pure hypercholesterolemia, unspecified: Secondary | ICD-10-CM | POA: Diagnosis not present

## 2023-07-07 IMAGING — MG MM DIGITAL SCREENING BILAT W/ TOMO AND CAD
8 series · 9 of 24 positions shown · non-contrast
Comparison: Previous exam(s).

CLINICAL DATA: Screening.

EXAM:
DIGITAL SCREENING BILATERAL MAMMOGRAM WITH TOMOSYNTHESIS AND CAD
TECHNIQUE: Bilateral screening digital craniocaudal and mediolateral oblique
mammograms were obtained. Bilateral screening digital breast
tomosynthesis was performed. The images were evaluated with
computer-aided detection.

[L CC synth-2D]
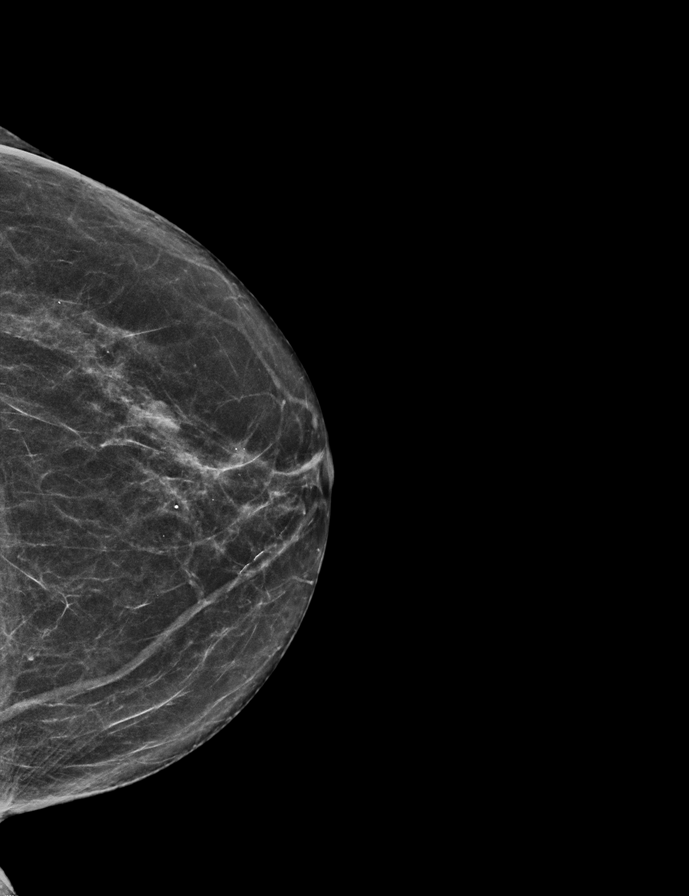

[R CC synth-2D]
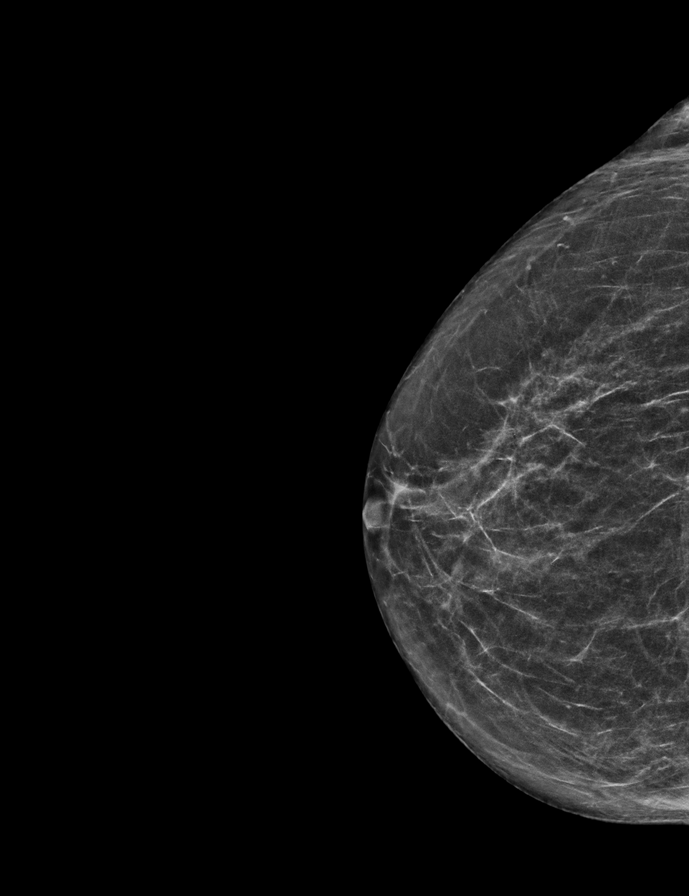

[R MLO synth-2D]
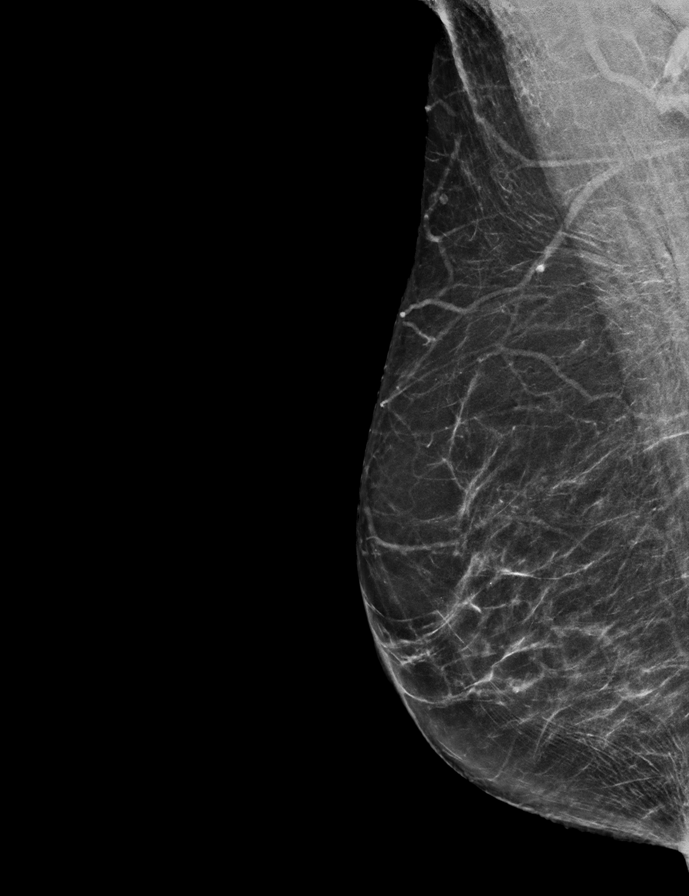

[L MLO synth-2D]
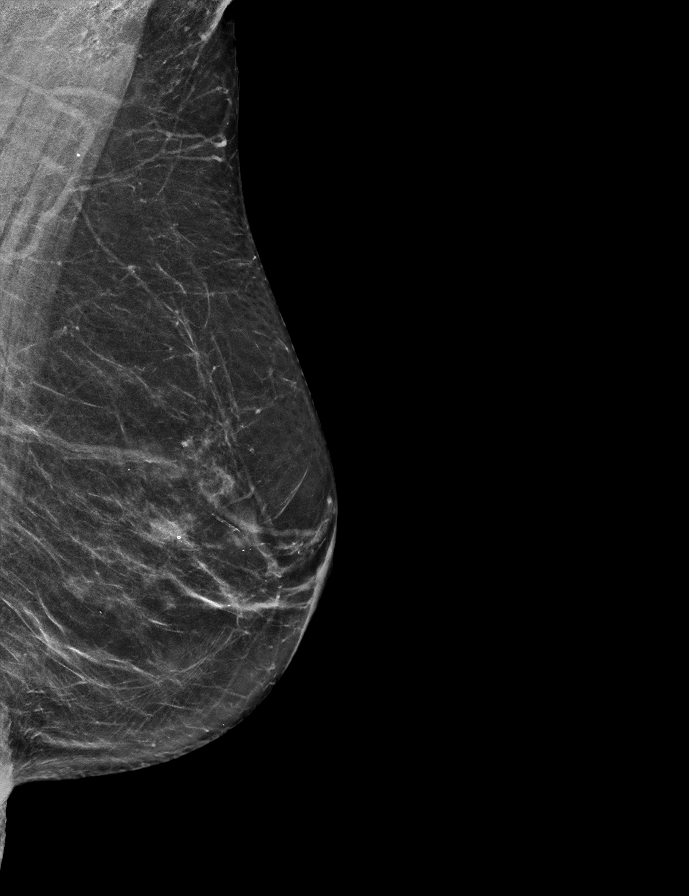

[R MLO tomo · 2 of 61 frames shown]
[frame 20/61]
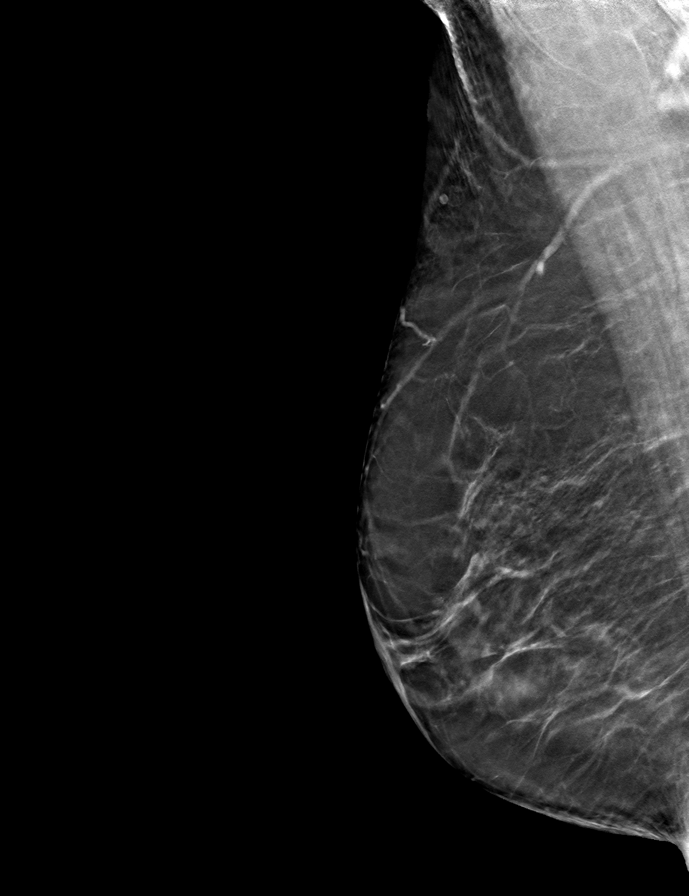
[frame 31/61]
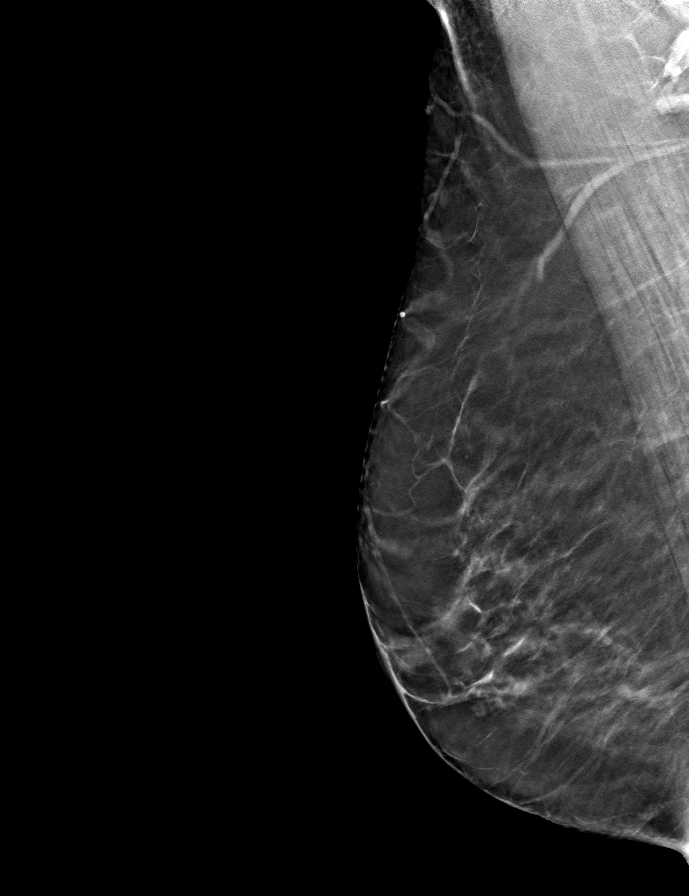

[L CC tomo · tomo slice 27/54.0]
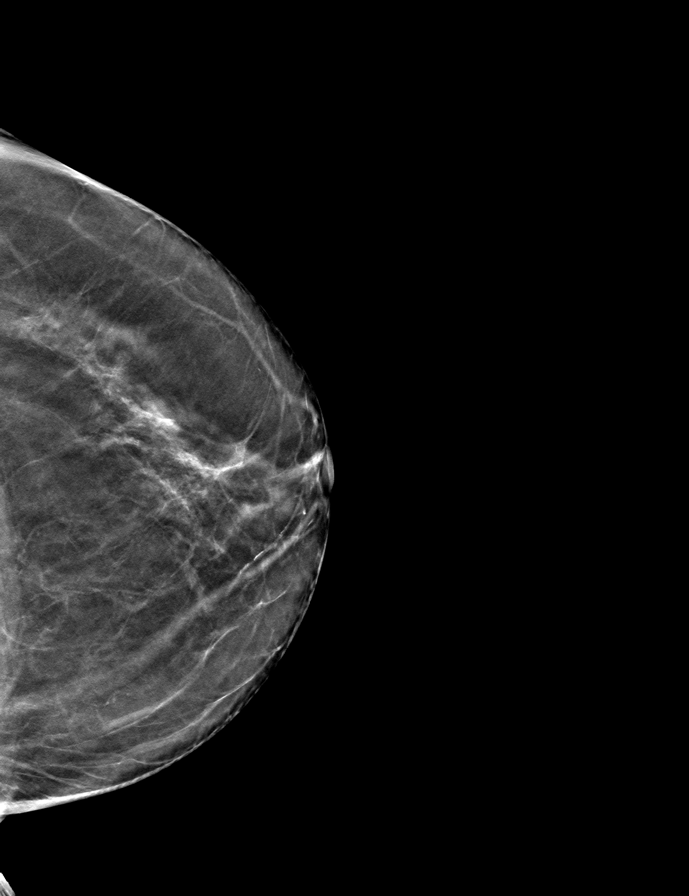

[L MLO tomo · tomo slice 31/60.0]
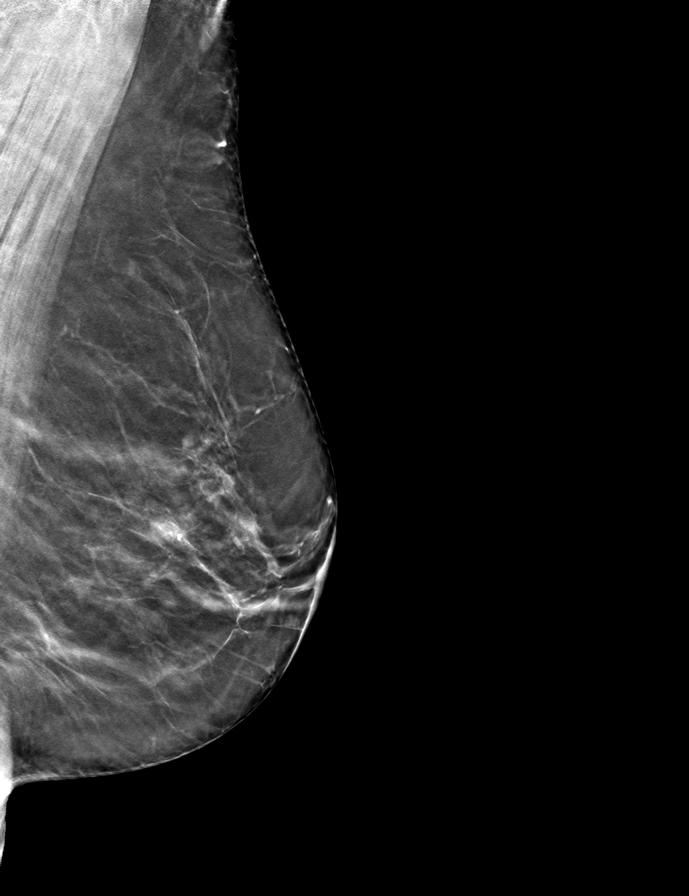

[R CC tomo · tomo slice 29/56.0]
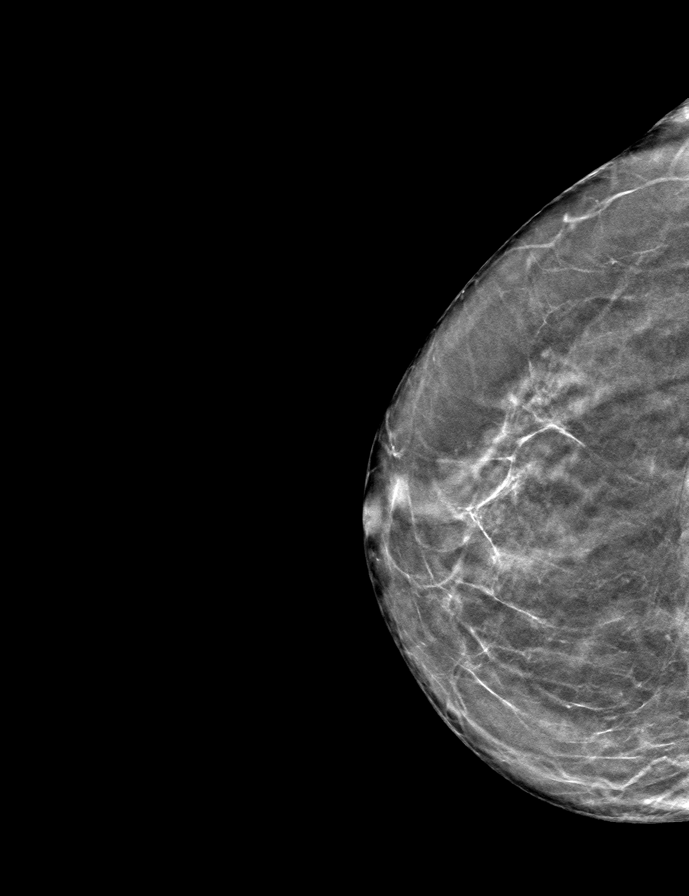

[9 of 24 positions shown; findings below may reference images not displayed]

ACR Breast Density Category b: There are scattered areas of
fibroglandular density.
FINDINGS: In the left breast, a possible mass warrants further evaluation. In
the right breast, no findings suspicious for malignancy.
IMPRESSION: Further evaluation is suggested for a possible mass in the left
breast.

RECOMMENDATION:
Diagnostic mammogram and possibly ultrasound of the left breast.
(Code:[id number])

The patient will be contacted regarding the findings, and additional
imaging will be scheduled.

BI-RADS CATEGORY  0: Incomplete. Need additional imaging evaluation
and/or prior mammograms for comparison.

## 2023-07-22 DIAGNOSIS — I10 Essential (primary) hypertension: Secondary | ICD-10-CM | POA: Diagnosis not present

## 2023-07-22 DIAGNOSIS — H2513 Age-related nuclear cataract, bilateral: Secondary | ICD-10-CM | POA: Diagnosis not present

## 2023-07-22 DIAGNOSIS — H2512 Age-related nuclear cataract, left eye: Secondary | ICD-10-CM | POA: Diagnosis not present

## 2023-09-25 ENCOUNTER — Other Ambulatory Visit: Payer: Self-pay | Admitting: Internal Medicine

## 2023-09-25 DIAGNOSIS — Z1231 Encounter for screening mammogram for malignant neoplasm of breast: Secondary | ICD-10-CM

## 2023-10-10 DIAGNOSIS — H2512 Age-related nuclear cataract, left eye: Secondary | ICD-10-CM | POA: Diagnosis not present

## 2023-10-11 DIAGNOSIS — H2511 Age-related nuclear cataract, right eye: Secondary | ICD-10-CM | POA: Diagnosis not present

## 2023-10-14 DIAGNOSIS — I10 Essential (primary) hypertension: Secondary | ICD-10-CM | POA: Diagnosis not present

## 2023-10-14 DIAGNOSIS — E7800 Pure hypercholesterolemia, unspecified: Secondary | ICD-10-CM | POA: Diagnosis not present

## 2023-10-14 DIAGNOSIS — R7309 Other abnormal glucose: Secondary | ICD-10-CM | POA: Diagnosis not present

## 2023-10-14 DIAGNOSIS — R222 Localized swelling, mass and lump, trunk: Secondary | ICD-10-CM | POA: Diagnosis not present

## 2023-10-16 DIAGNOSIS — H2512 Age-related nuclear cataract, left eye: Secondary | ICD-10-CM | POA: Diagnosis not present

## 2023-10-22 ENCOUNTER — Ambulatory Visit
Admission: RE | Admit: 2023-10-22 | Discharge: 2023-10-22 | Disposition: A | Discharge status: Home / Self Care | Source: Ambulatory Visit | Attending: Internal Medicine | Admitting: Internal Medicine

## 2023-10-22 DIAGNOSIS — Z1231 Encounter for screening mammogram for malignant neoplasm of breast: Secondary | ICD-10-CM | POA: Insufficient documentation

## 2023-10-25 ENCOUNTER — Other Ambulatory Visit: Payer: Self-pay | Admitting: Internal Medicine

## 2023-10-25 DIAGNOSIS — R222 Localized swelling, mass and lump, trunk: Secondary | ICD-10-CM

## 2023-10-31 DIAGNOSIS — H2511 Age-related nuclear cataract, right eye: Secondary | ICD-10-CM | POA: Diagnosis not present

## 2023-10-31 DIAGNOSIS — H25041 Posterior subcapsular polar age-related cataract, right eye: Secondary | ICD-10-CM | POA: Diagnosis not present

## 2023-10-31 DIAGNOSIS — H25011 Cortical age-related cataract, right eye: Secondary | ICD-10-CM | POA: Diagnosis not present

## 2023-11-04 ENCOUNTER — Other Ambulatory Visit

## 2023-11-04 ENCOUNTER — Ambulatory Visit
Admission: RE | Admit: 2023-11-04 | Discharge: 2023-11-04 | Disposition: A | Discharge status: Home / Self Care | Source: Ambulatory Visit | Attending: Internal Medicine | Admitting: Internal Medicine

## 2023-11-04 DIAGNOSIS — R222 Localized swelling, mass and lump, trunk: Secondary | ICD-10-CM | POA: Insufficient documentation

## 2023-11-04 DIAGNOSIS — I700 Atherosclerosis of aorta: Secondary | ICD-10-CM | POA: Diagnosis not present

## 2023-11-04 MED ORDER — IOHEXOL 300 MG/ML  SOLN
75.0000 mL | Freq: Once | INTRAMUSCULAR | Status: AC | PRN
  Administered 2023-11-04 (×2): 75 mL via INTRAVENOUS

## 2023-11-07 DIAGNOSIS — H2511 Age-related nuclear cataract, right eye: Secondary | ICD-10-CM | POA: Diagnosis not present

## 2023-11-20 DIAGNOSIS — R609 Edema, unspecified: Secondary | ICD-10-CM | POA: Diagnosis not present

## 2023-11-20 DIAGNOSIS — K760 Fatty (change of) liver, not elsewhere classified: Secondary | ICD-10-CM | POA: Diagnosis not present

## 2023-11-20 DIAGNOSIS — I10 Essential (primary) hypertension: Secondary | ICD-10-CM | POA: Diagnosis not present

## 2023-11-20 DIAGNOSIS — I2510 Atherosclerotic heart disease of native coronary artery without angina pectoris: Secondary | ICD-10-CM | POA: Diagnosis not present

## 2023-11-22 ENCOUNTER — Other Ambulatory Visit: Payer: Self-pay | Admitting: Internal Medicine

## 2023-11-22 DIAGNOSIS — R609 Edema, unspecified: Secondary | ICD-10-CM

## 2023-12-18 DIAGNOSIS — Z0000 Encounter for general adult medical examination without abnormal findings: Secondary | ICD-10-CM | POA: Diagnosis not present

## 2023-12-18 DIAGNOSIS — R222 Localized swelling, mass and lump, trunk: Secondary | ICD-10-CM | POA: Diagnosis not present

## 2023-12-18 DIAGNOSIS — R7309 Other abnormal glucose: Secondary | ICD-10-CM | POA: Diagnosis not present

## 2023-12-18 DIAGNOSIS — L988 Other specified disorders of the skin and subcutaneous tissue: Secondary | ICD-10-CM | POA: Diagnosis not present

## 2023-12-18 DIAGNOSIS — E042 Nontoxic multinodular goiter: Secondary | ICD-10-CM | POA: Diagnosis not present

## 2023-12-18 DIAGNOSIS — M67431 Ganglion, right wrist: Secondary | ICD-10-CM | POA: Diagnosis not present

## 2023-12-18 DIAGNOSIS — E7800 Pure hypercholesterolemia, unspecified: Secondary | ICD-10-CM | POA: Diagnosis not present

## 2023-12-18 DIAGNOSIS — I10 Essential (primary) hypertension: Secondary | ICD-10-CM | POA: Diagnosis not present

## 2023-12-25 DIAGNOSIS — R7309 Other abnormal glucose: Secondary | ICD-10-CM | POA: Diagnosis not present

## 2023-12-25 DIAGNOSIS — I10 Essential (primary) hypertension: Secondary | ICD-10-CM | POA: Diagnosis not present

## 2023-12-25 DIAGNOSIS — D219 Benign neoplasm of connective and other soft tissue, unspecified: Secondary | ICD-10-CM | POA: Diagnosis not present

## 2023-12-25 DIAGNOSIS — Z23 Encounter for immunization: Secondary | ICD-10-CM | POA: Diagnosis not present

## 2023-12-25 DIAGNOSIS — E7800 Pure hypercholesterolemia, unspecified: Secondary | ICD-10-CM | POA: Diagnosis not present

## 2023-12-27 DIAGNOSIS — R222 Localized swelling, mass and lump, trunk: Secondary | ICD-10-CM | POA: Diagnosis not present

## 2023-12-27 NOTE — Progress Notes (Signed)
 SURGERY CLINIC New Patient Clinic Note   Patient Name: Tracy Andrews Patient Age: 78 y.o. Encounter Date: 12/27/2023 Attending Physician: DR. Vicenta Gee Service: General and Bariatric Surgery Referring Provider: Sadie Manna, MD [address] [facility] [place],  [place] [address] Primary Care Provider: Sadie Manna, MD 12/27/2023    ASSESSMENT/PLAN: Any labs and radiology results that were available during my care of the patient were independently reviewed by me and considered in my medical decision making.  Impression:  Tracy Andrews is a 78 y.o. female with PMH significant for HLT, HTN and elevated A1C   REASON FOR VISIT:   left shoulder mass    ASSESSMENT:  Problem List Items Addressed This Visit   None Visit Diagnoses       Mass of chest wall, left    -  Primary   Relevant Orders   MRI Chest W Contrast MSK         PLAN:   1. MRI ordered  2. Follow up with Dr. Gee after MRI   HISTORY OF PRESENT ILLNESS:  Tracy Andrews is a 78 y.o. female seen in consultation at the request of Dr. Sadie for evaluation of Left shoulder mass.  History of Present Illness Tracy Andrews is a 78 year old female who presents with a knot on her left shoulder.  She noticed a knot on her left shoulder in July, which she describes as popping under her shoulder blade and slipping in and out. It is difficult for her to see due to its location. The knot is sometimes painful, especially when dressing, and occasionally feels irritating when lying on her left side, prompting her to change positions. There is no change in color, drainage, or significant tenderness requiring medication.  She has undergone an x-ray, CT scan, and ultrasound at [facility] to evaluate the knot. She reports that an x-ray was performed first, followed by a CT scan and an ultrasound. The ultrasound reportedly showed another knot on the right side, though she is unsure about  this finding.  No unexplained weight changes, fevers, chills, or respiratory symptoms such as coughing or shortness of breath. She denies the use of tobacco, alcohol , or recreational drugs. Her past surgical history includes two C-sections, a left wrist injury from jamming, and tonsillectomy.  Previous imaging:  1) Left scapula x-ray 10/14/23 No focal or acute osseous abnormalities.  Decreased bone mineralization.   2) CT chest 11/04/23  No CT findings of the chest to explain palpable abnormality of  the left shoulder. No mass, cyst, or bony abnormality of the left  shoulder blade.  Hepatic steatosis.  Coronary artery disease.   3) US  left chest 12/18/23 Heterogeneous echogenic lesion medial to the left scapula along the chest wall measuring 2.3 x 0.9 x 1.8 cm with minimal internal vascularity. On dynamic imaging, the lesion appears mobile.  No fluid collections identified.    A similar though smaller mixed echogenicity lesion is seen in the partially imaged right upper posterior chest.    REVIEW OF SYSTEMS:   Denies fevers, chills, sweats, nausea, vomiting, abdominal pain, chest pain, shortness of breath, cough, heart palpitations, weight loss, change in bowel habits, melena or hematochezia. Denies lower extremity edema.  Denies dysuria, hematuria or urinary incontinence. Denies focal bony pain.  Remainder of a 10 system review of systems is negative.   ALLERGIES: has no known allergies.  MEDICATIONS:  Current Medications[1]  MEDICAL HISTORY:  Past Medical History[2]  SURGICAL HISTORY: Past Surgical History[3]  SOCIAL HISTORY:    reports that she has never smoked. She has never used smokeless tobacco.   FAMILY HISTORY: family history is not on file.   Objective  Vital signs: BP 168/78 (BP Position: Sitting, BP Cuff Size: Large)   Pulse 62   Temp 36.6 C (97.9 F) (Temporal)   Ht 162.6 cm (5' 4)   Wt 59.3 kg (130 lb 12.8 oz)   LMP 10/07/1996 (Approximate)   SpO2 98%    BMI 22.45 kg/m   Wt Readings from Last 3 Encounters:  12/27/23 59.3 kg (130 lb 12.8 oz)    Physical Exam Vitals and nursing note reviewed.  Constitutional:      Appearance: Normal appearance. She is not toxic-appearing.  Cardiovascular:     Rate and Rhythm: Normal rate and regular rhythm.     Pulses: Normal pulses.     Heart sounds: Normal heart sounds.  Pulmonary:     Effort: Pulmonary effort is normal.     Breath sounds: Normal breath sounds.  Skin:    General: Skin is warm and dry.     Capillary Refill: Capillary refill takes 2 to 3 seconds.     Findings: No bruising or erythema.     Comments: Soft cystic lesion left scapular region  Neurological:     General: No focal deficit present.     Mental Status: She is alert and oriented to person, place, and time.  Psychiatric:        Mood and Affect: Mood normal.        Behavior: Behavior normal.        Thought Content: Thought content normal.        Judgment: Judgment normal.     LABS: No visits with results within 7 Day(s) from this visit.  Latest known visit with results is:  No results found for any previous visit.     DIAGNOSTIC STUDIES:  US  Chest Left Result Date: 12/18/2023 EXAM: US  CHEST LEFT ACCESSION: [id number] UN REPORT DATE: 12/18/2023 2:41 PM CLINICAL INDICATION: 78 years old with Soft tissue swelling of back  - R60.9 - Soft tissue swelling of back  TECHNIQUE: Static and cine images of the region of interest in the left posterior chest overlying the scapula were performed. Limited color Doppler imaging. Contralateral images obtained for comparison purposes. COMPARISON: None available, outside CT report 11/04/2023 FINDINGS: Heterogeneous echogenic lesion medial to the left scapula along the chest wall measuring 2.3 x 0.9 x 1.8 cm with minimal internal vascularity. On dynamic imaging, the lesion appears mobile. No fluid collections identified. A similar though smaller mixed echogenicity lesion is seen in the  partially imaged right upper posterior chest.   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 Would consider direct comparison with the prior chest CT images.   External Radiology -Ultrasound Result Date: 12/18/2023 Ordered by an unspecified provider.    I saw and evaluated the patient and discussed the assessment and plan with the APP.  I provided the required supervision for all services billed.  I independently reviewed the image(s) and agree with the interpretation and plan as documented in the APP's note. VICENTA GORMAN GEE, MD       [1] Current Outpatient Medications  Medication Sig Dispense Refill  . amlodipine (NORVASC) 2.5 MG tablet Take 1 tablet (2.5 mg total) by mouth daily.    SABRA amlodipine (NORVASC) 5 MG tablet Take 1 tablet (5 mg total) by mouth daily.    SABRA aspirin (ECOTRIN) 81 MG tablet Take  1 tablet (81 mg total) by mouth daily.    . calcium carbonate 1,500 mg (600 mg elem calcium) tablet Take 240 mg elem calcium by mouth two (2) times a day.    . cholecalciferol, vitamin D3-25 mcg, 1,000 unit,, 25 mcg (1,000 unit) capsule Take 1 capsule (25 mcg total) by mouth daily.    . cyanocobalamin, vitamin B-12, 1000 MCG tablet Take 1 tablet (1,000 mcg total) by mouth daily.    . fluticasone propionate (FLONASE) 50 mcg/actuation nasal spray 1 spray into each nostril daily as needed for allergies.    . magnesium 250 mg Tab Take 1 tablet by mouth daily.    . metroNIDAZOLE  (METROCREAM ) 0.75 % cream Apply 1 Application topically two (2) times a day.    . simvastatin (ZOCOR) 20 MG tablet Take 1 tablet (20 mg total) by mouth nightly.     No current facility-administered medications for this visit.  [2] No past medical history on file. [3] Past Surgical History: Procedure Laterality Date  . BREAST BIOPSY Left 1998   benign FNA

## 2024-01-28 ENCOUNTER — Ambulatory Visit: Payer: PPO | Admitting: Dermatology

## 2024-01-28 ENCOUNTER — Encounter: Payer: Self-pay | Admitting: Dermatology

## 2024-01-28 DIAGNOSIS — L820 Inflamed seborrheic keratosis: Secondary | ICD-10-CM

## 2024-01-28 DIAGNOSIS — L578 Other skin changes due to chronic exposure to nonionizing radiation: Secondary | ICD-10-CM

## 2024-01-28 DIAGNOSIS — L814 Other melanin hyperpigmentation: Secondary | ICD-10-CM

## 2024-01-28 DIAGNOSIS — L821 Other seborrheic keratosis: Secondary | ICD-10-CM

## 2024-01-28 DIAGNOSIS — L570 Actinic keratosis: Secondary | ICD-10-CM | POA: Diagnosis not present

## 2024-01-28 DIAGNOSIS — W908XXA Exposure to other nonionizing radiation, initial encounter: Secondary | ICD-10-CM

## 2024-01-28 DIAGNOSIS — D1801 Hemangioma of skin and subcutaneous tissue: Secondary | ICD-10-CM

## 2024-01-28 DIAGNOSIS — Z1283 Encounter for screening for malignant neoplasm of skin: Secondary | ICD-10-CM

## 2024-01-28 DIAGNOSIS — D229 Melanocytic nevi, unspecified: Secondary | ICD-10-CM

## 2024-01-28 NOTE — Progress Notes (Signed)
 Follow-Up Visit   Subjective  Tracy Andrews is a 78 y.o. female who presents for the following: Skin Cancer Screening and Full Body Skin Exam. History of BCC L cheek.  The patient presents for Total-Body Skin Exam (TBSE) for skin cancer screening and mole check. The patient has spots, moles and lesions to be evaluated, some may be new or changing.   The following portions of the chart were reviewed this encounter and updated as appropriate: medications, allergies, medical history  Review of Systems:  No other skin or systemic complaints except as noted in HPI or Assessment and Plan.  Objective  Well appearing patient in no apparent distress; mood and affect are within normal limits.  A full examination was performed including scalp, head, eyes, ears, nose, lips, neck, chest, axillae, abdomen, back, buttocks, bilateral upper extremities, bilateral lower extremities, hands, feet, fingers, toes, fingernails, and toenails. All findings within normal limits unless otherwise noted below.   Relevant physical exam findings are noted in the Assessment and Plan.  L forearm Erythematous stuck-on, waxy papule.  L upper forehead Pink scaly macule.  Assessment & Plan   SKIN CANCER SCREENING PERFORMED TODAY.  ACTINIC DAMAGE - Chronic condition, secondary to cumulative UV/sun exposure - diffuse scaly erythematous macules with underlying dyspigmentation - Recommend daily broad spectrum sunscreen SPF 30+ to sun-exposed areas, reapply every 2 hours as needed.  - Staying in the shade or wearing long sleeves, sun glasses (UVA+UVB protection) and wide brim hats (4-inch brim around the entire circumference of the hat) are also recommended for sun protection.  - Call for new or changing lesions.  LENTIGINES, SEBORRHEIC KERATOSES, HEMANGIOMAS - Benign normal skin lesions - Benign-appearing - Call for any changes  MELANOCYTIC NEVI - Tan-brown and/or pink-flesh-colored symmetric macules and  papules - Benign appearing on exam today - Observation - Call clinic for new or changing moles - Recommend daily use of broad spectrum spf 30+ sunscreen to sun-exposed areas.   INFLAMED SEBORRHEIC KERATOSIS L forearm Symptomatic, irritating, patient would like treated. Destruction of lesion - L forearm  Destruction method: cryotherapy   Informed consent: discussed and consent obtained   Lesion destroyed using liquid nitrogen: Yes   Region frozen until ice ball extended beyond lesion: Yes   Outcome: patient tolerated procedure well with no complications   Post-procedure details: wound care instructions given   Additional details:  Prior to procedure, discussed risks of blister formation, small wound, skin dyspigmentation, or rare scar following cryotherapy. Recommend Vaseline ointment to treated areas while healing.   AK (ACTINIC KERATOSIS) L upper forehead Actinic keratoses are precancerous spots that appear secondary to cumulative UV radiation exposure/sun exposure over time. They are chronic with expected duration over 1 year. A portion of actinic keratoses will progress to squamous cell carcinoma of the skin. It is not possible to reliably predict which spots will progress to skin cancer and so treatment is recommended to prevent development of skin cancer.  Recommend daily broad spectrum sunscreen SPF 30+ to sun-exposed areas, reapply every 2 hours as needed.  Recommend staying in the shade or wearing long sleeves, sun glasses (UVA+UVB protection) and wide brim hats (4-inch brim around the entire circumference of the hat). Call for new or changing lesions. Destruction of lesion - L upper forehead  Destruction method: cryotherapy   Informed consent: discussed and consent obtained   Lesion destroyed using liquid nitrogen: Yes   Region frozen until ice ball extended beyond lesion: Yes   Outcome: patient tolerated procedure  well with no complications   Post-procedure details: wound  care instructions given   Additional details:  Prior to procedure, discussed risks of blister formation, small wound, skin dyspigmentation, or rare scar following cryotherapy. Recommend Vaseline ointment to treated areas while healing.   Return in about 1 year (around 01/27/2025) for TBSE, Hx BCC.  IAndrea Kerns, CMA, am acting as scribe for Rexene Rattler, MD .   Documentation: I have reviewed the above documentation for accuracy and completeness, and I agree with the above.  Rexene Rattler, MD

## 2024-01-28 NOTE — Patient Instructions (Addendum)

## 2024-02-07 DIAGNOSIS — D3131 Benign neoplasm of right choroid: Secondary | ICD-10-CM | POA: Diagnosis not present

## 2024-02-07 DIAGNOSIS — H4322 Crystalline deposits in vitreous body, left eye: Secondary | ICD-10-CM | POA: Diagnosis not present

## 2024-02-07 DIAGNOSIS — H26493 Other secondary cataract, bilateral: Secondary | ICD-10-CM | POA: Diagnosis not present

## 2024-02-25 DIAGNOSIS — R222 Localized swelling, mass and lump, trunk: Secondary | ICD-10-CM | POA: Diagnosis not present

## 2025-02-09 ENCOUNTER — Encounter: Admitting: Dermatology
